# Patient Record
Sex: Female | Born: 1950 | Race: White | Hispanic: No | Smoking: Never smoker
Health system: Southern US, Community
[De-identification: ages and names within clinical notes are randomized; demographics above are authoritative.]

## PROBLEM LIST (undated history)

## (undated) DIAGNOSIS — R82993 Hyperuricosuria: Secondary | ICD-10-CM

## (undated) DIAGNOSIS — T7840XA Allergy, unspecified, initial encounter: Secondary | ICD-10-CM

## (undated) DIAGNOSIS — N200 Calculus of kidney: Secondary | ICD-10-CM

## (undated) DIAGNOSIS — R82994 Hypercalciuria: Secondary | ICD-10-CM

## (undated) HISTORY — PX: TONSILLECTOMY: SUR1361

## (undated) HISTORY — PX: OTHER SURGICAL HISTORY: SHX169

## (undated) HISTORY — DX: Calculus of kidney: N20.0

## (undated) HISTORY — PX: ABDOMINAL HYSTERECTOMY: SHX81

## (undated) HISTORY — DX: Allergy, unspecified, initial encounter: T78.40XA

## (undated) HISTORY — PX: ADENOIDECTOMY: SUR15

## (undated) HISTORY — DX: Hypercalciuria: R82.994

## (undated) HISTORY — DX: Hyperuricosuria: R82.993

---

## 2018-06-16 DIAGNOSIS — N200 Calculus of kidney: Secondary | ICD-10-CM | POA: Insufficient documentation

## 2018-12-15 DIAGNOSIS — R82994 Hypercalciuria: Secondary | ICD-10-CM | POA: Insufficient documentation

## 2018-12-15 DIAGNOSIS — R82993 Hyperuricosuria: Secondary | ICD-10-CM | POA: Insufficient documentation

## 2021-04-07 ENCOUNTER — Inpatient Hospital Stay: Payer: Medicare Other | Attending: Internal Medicine | Admitting: Internal Medicine

## 2021-04-07 ENCOUNTER — Other Ambulatory Visit: Payer: Self-pay

## 2021-04-07 ENCOUNTER — Inpatient Hospital Stay: Payer: Medicare Other

## 2021-04-07 ENCOUNTER — Encounter: Payer: Self-pay | Admitting: Internal Medicine

## 2021-04-07 DIAGNOSIS — Z79899 Other long term (current) drug therapy: Secondary | ICD-10-CM | POA: Diagnosis not present

## 2021-04-07 DIAGNOSIS — Z808 Family history of malignant neoplasm of other organs or systems: Secondary | ICD-10-CM | POA: Diagnosis not present

## 2021-04-07 DIAGNOSIS — Z8049 Family history of malignant neoplasm of other genital organs: Secondary | ICD-10-CM

## 2021-04-07 DIAGNOSIS — D72820 Lymphocytosis (symptomatic): Secondary | ICD-10-CM | POA: Diagnosis not present

## 2021-04-07 DIAGNOSIS — D751 Secondary polycythemia: Secondary | ICD-10-CM

## 2021-04-07 DIAGNOSIS — Z809 Family history of malignant neoplasm, unspecified: Secondary | ICD-10-CM

## 2021-04-07 LAB — CBC WITH DIFFERENTIAL/PLATELET
Abs Immature Granulocytes: 0 10*3/uL (ref 0.00–0.07)
Band Neutrophils: 0 %
Basophils Absolute: 0 10*3/uL (ref 0.0–0.1)
Basophils Relative: 0 %
Blasts: 0 %
Eosinophils Absolute: 0.1 10*3/uL (ref 0.0–0.5)
Eosinophils Relative: 1 %
HCT: 47.6 % — ABNORMAL HIGH (ref 36.0–46.0)
Hemoglobin: 16 g/dL — ABNORMAL HIGH (ref 12.0–15.0)
Lymphocytes Relative: 73 %
Lymphs Abs: 10.2 10*3/uL — ABNORMAL HIGH (ref 0.7–4.0)
MCH: 31.6 pg (ref 26.0–34.0)
MCHC: 33.6 g/dL (ref 30.0–36.0)
MCV: 94.1 fL (ref 80.0–100.0)
Metamyelocytes Relative: 0 %
Monocytes Absolute: 0.4 10*3/uL (ref 0.1–1.0)
Monocytes Relative: 3 %
Myelocytes: 0 %
Neutro Abs: 3.2 10*3/uL (ref 1.7–7.7)
Neutrophils Relative %: 23 %
Other: 0 %
Platelets: 282 10*3/uL (ref 150–400)
Promyelocytes Relative: 0 %
RBC: 5.06 MIL/uL (ref 3.87–5.11)
RDW: 13.2 % (ref 11.5–15.5)
Smear Review: ADEQUATE
WBC: 13.9 10*3/uL — ABNORMAL HIGH (ref 4.0–10.5)
nRBC: 0 % (ref 0.0–0.2)
nRBC: 0 /100 WBC

## 2021-04-07 LAB — LACTATE DEHYDROGENASE: LDH: 151 U/L (ref 98–192)

## 2021-04-07 NOTE — Progress Notes (Signed)
New patient evaluation.   

## 2021-04-07 NOTE — Progress Notes (Signed)
[name] OFFICE PROGRESS NOTE  No care team member to display   # HEMATOLOGY HISTORY:  # LEUCOCYTOSIS- WBC-; N; L; Hb- platelets   Oncology History   No history exists.      INTERVAL HISTORY: Ambulating independently.  Accompanied by husband/wheelchair.  Erica Barton 70 y.o.  female pleasant patient without any significant past medical history noted to have slightly elevated lymphocytes along with leukocytosis-referred to [place] for further evaluation recommendations.  Patient denies any nausea vomiting abdominal pain.  Night sweats: None Weight loss: None Early satiety: None   Review of Systems  Constitutional:  Negative for chills, diaphoresis, fever, malaise/fatigue and weight loss.  HENT:  Negative for nosebleeds and sore throat.   Eyes:  Negative for double vision.  Respiratory:  Negative for cough, hemoptysis, sputum production, shortness of breath and wheezing.   Cardiovascular:  Negative for chest pain, palpitations, orthopnea and leg swelling.  Gastrointestinal:  Negative for abdominal pain, blood in stool, constipation, diarrhea, heartburn, melena, nausea and vomiting.  Genitourinary:  Negative for dysuria, frequency and urgency.  Musculoskeletal:  Negative for back pain and joint pain.  Skin: Negative.  Negative for itching and rash.  Neurological:  Negative for dizziness, tingling, focal weakness, weakness and headaches.  Endo/Heme/Allergies:  Does not bruise/bleed easily.  Psychiatric/Behavioral:  Negative for depression. The patient is not nervous/anxious and does not have insomnia.      PAST MEDICAL HISTORY :  Past Medical History:  Diagnosis Date   Allergy    Hypercalciuria    Hyperuricosuria    Nephrolithiasis     PAST SURGICAL HISTORY :   Past Surgical History:  Procedure Laterality Date   ABDOMINAL HYSTERECTOMY     ADENOIDECTOMY     anterior cervicl cal fusion     TONSILLECTOMY     triger finger release Left     FAMILY  HISTORY :   Family History  Problem Relation Age of Onset   Uterine cancer Mother    Non-Hodgkin's lymphoma Brother    Cancer Maternal Grandmother        unknown   Cancer Maternal Grandfather        unknown   Cancer Paternal Grandmother        unknown   Cancer Paternal Grandfather        unknown    SOCIAL HISTORY:   Social History   Tobacco Use   Smoking status: Never   Smokeless tobacco: Never  Substance Use Topics   Alcohol use: Never   Drug use: Never    ALLERGIES:  is allergic to beef (bovine) protein, contrast media [iodinated diagnostic agents], and gadolinium derivatives.  MEDICATIONS:  Current Outpatient Medications  Medication Sig Dispense Refill   amiloride-hydrochlorothiazide (MODURETIC) 5-50 MG tablet Take 1 tablet by mouth daily.     Cholecalciferol (VITAMIN D) 50 MCG (2000 UT) tablet Take 2,000 Units by mouth daily.     diphenhydrAMINE (BENADRYL) 50 MG capsule Take by mouth.     EPINEPHrine 0.3 mg/0.3 mL IJ SOAJ injection epinephrine 0.3 mg/0.3 mL injection, auto-injector     estradiol (ESTRACE) 0.1 MG/GM vaginal cream Place 1 g vaginally once a week.     hydrOXYzine (ATARAX/VISTARIL) 25 MG tablet 1-2 every 6-8 hours for itching     Multiple Vitamins-Minerals (CENTRUM SILVER) tablet Centrum Silver  1 tablet by mouth once daily     Omega-3 Fatty Acids (OMEGA-3 2100) 1050 MG CAPS Omega 3  1280 mg - 3 capsules once daily  predniSONE (DELTASONE) 50 MG tablet Take by mouth.     No current facility-administered medications for this visit.    PHYSICAL EXAMINATION:  BP 123/76   Pulse 87   Temp (!) 96.4 F (35.8 C)   Resp 16   Wt 140 lb (63.5 kg)   Filed Weights   04/07/21 1115  Weight: 140 lb (63.5 kg)    Physical Exam Vitals and nursing note reviewed.  HENT:     Head: Normocephalic and atraumatic.     Mouth/Throat:     Pharynx: Oropharynx is clear.  Eyes:     Extraocular Movements: Extraocular movements intact.     Pupils: Pupils are  equal, round, and reactive to light.  Cardiovascular:     Rate and Rhythm: Normal rate and regular rhythm.  Pulmonary:     Comments: Decreased breath sounds bilaterally.  Abdominal:     Palpations: Abdomen is soft.  Musculoskeletal:        General: Normal range of motion.     Cervical back: Normal range of motion.  Skin:    General: Skin is warm.  Neurological:     General: No focal deficit present.     Mental Status: She is alert and oriented to person, place, and time.  Psychiatric:        Behavior: Behavior normal.        Judgment: Judgment normal.       LABORATORY DATA:  I have reviewed the data as listed No results found for: NA, K, CL, CO2, GLUCOSE, BUN, CREATININE, CALCIUM, PROT, ALBUMIN, AST, ALT, ALKPHOS, BILITOT, GFRNONAA, GFRAA  No results found for: SPEP, UPEP  Lab Results  Component Value Date   WBC 13.9 (H) 04/07/2021   NEUTROABS 3.2 04/07/2021   HGB 16.0 (H) 04/07/2021   HCT 47.6 (H) 04/07/2021   MCV 94.1 04/07/2021   PLT 282 04/07/2021      Chemistry   No results found for: NA, K, CL, CO2, BUN, CREATININE, GLU No results found for: CALCIUM, ALKPHOS, AST, ALT, BILITOT     RADIOGRAPHIC STUDIES: I have personally reviewed the radiological images as listed and agreed with the findings in the report. No results found.   ASSESSMENT & PLAN:  Lymphocytosis # SEP 2022- WBC 15; ALC 12; Hb-16; platlets normal. peripheral smear- atypical lymphocytes.  However.  Patient fairly asymptomatic from underlying blood work.  #Discussed with the patient and family that-lymphocytosis is likely suggestive of a low-grade lymphoma/leukemia; less likely reactive.  Recommend CBC CMP LDH; peripheral blood flow cytometry.  Hold off a bone marrow biopsy at this time.  CT scan October 2022-negative.  # Discussed that if patient is diagnosed with low-grade lymphoma/leukemia-especially asymptomatic/with normal hemoglobin/platelets-surveillance would be the choice of management.   However await above work-up for further recommendations and plan.  # Elevated Hb 16/HCT 46-no evidence of any hypoxia or smoking.  Check JAK2 mutation.  Thank you Dr.Lambert for allowing me to participate in the care of your pleasant patient. Please do not hesitate to contact me with questions or concerns in the interim.  # DISPOSITION: # labs today-  # # Follow up in 2 weeks- MD; virtual- No labs-Dr.B    Orders Placed This Encounter  Procedures   Flow cytometry panel-leukemia/lymphoma work-up    Standing Status:   Future    Number of Occurrences:   1    Standing Expiration Date:   04/07/2022   JAK2 genotypr    Standing Status:   Future  Number of Occurrences:   1    Standing Expiration Date:   04/07/2022   CBC with Differential/Platelet    Standing Status:   Future    Number of Occurrences:   1    Standing Expiration Date:   04/07/2022   Lactate dehydrogenase    Standing Status:   Future    Number of Occurrences:   1    Standing Expiration Date:   04/07/2022    All questions were answered. The patient knows to call the clinic with any problems, questions or concerns.      [name], MD 04/12/2021 10:18 PM

## 2021-04-07 NOTE — Assessment & Plan Note (Addendum)
#  SEP 2022- WBC 15; ALC 12; Hb-16; platlets normal. peripheral smear- atypical lymphocytes.  However.  Patient fairly asymptomatic from underlying blood work.  #Discussed with the patient and family that-lymphocytosis is likely suggestive of a low-grade lymphoma/leukemia; less likely reactive.  Recommend CBC CMP LDH; peripheral blood flow cytometry.  Hold off a bone marrow biopsy at this time.  CT scan October 2022-negative.  # Discussed that if patient is diagnosed with low-grade lymphoma/leukemia-especially asymptomatic/with normal hemoglobin/platelets-surveillance would be the choice of management.  However await above work-up for further recommendations and plan.  # Elevated Hb 16/HCT 46-no evidence of any hypoxia or smoking.  Check JAK2 mutation.  Thank you Dr.Lambert for allowing me to participate in the care of your pleasant patient. Please do not hesitate to contact me with questions or concerns in the interim.  # DISPOSITION: # labs today-  # # Follow up in 2 weeks- MD; virtual- No labs-Dr.B

## 2021-04-11 LAB — COMP PANEL: LEUKEMIA/LYMPHOMA: Immunophenotypic Profile: 51

## 2021-04-17 LAB — JAK2 GENOTYPR

## 2021-04-18 ENCOUNTER — Other Ambulatory Visit: Payer: Self-pay

## 2021-04-18 ENCOUNTER — Inpatient Hospital Stay: Payer: Medicare Other | Attending: Internal Medicine | Admitting: Internal Medicine

## 2021-04-18 DIAGNOSIS — C9110 Chronic lymphocytic leukemia of B-cell type not having achieved remission: Secondary | ICD-10-CM

## 2021-04-18 DIAGNOSIS — D72820 Lymphocytosis (symptomatic): Secondary | ICD-10-CM

## 2021-04-18 NOTE — Assessment & Plan Note (Signed)
#  SEP 2022- WBC 15; ALC 12; Hb-16; platlets normal. peripheral smear- atypical lymphocytes.  However.  Patient fairly asymptomatic.  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  Recommend CT scan chest and pelvis at this time.   #Discussed that as patient is asymptomatic at this time-would recommend holding off any bone marrow biopsy; unless the CT scan is concerning for active lymphoma.  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 monitor for now.  Low clinical concerns for thromboembolic events at this time.  # DISPOSITION: # CT CAP in 1-2 weeks- # follow up TBD- Dr.B  

## 2021-04-18 NOTE — Progress Notes (Signed)
I connected with Erica Barton on 04/18/21 at  3:30 PM EST by video enabled telemedicine visit and verified that I am speaking with the correct person using two identifiers.  I discussed the limitations, risks, security and privacy concerns of performing an evaluation and management service by telemedicine and the availability of in-person appointments. I also discussed with the patient that there may be a patient responsible charge related to this service. The patient expressed understanding and agreed to proceed.    Other persons participating in the visit and their role in the encounter: RN/medical reconciliation Patient's location: home Provider's location: office  Oncology History   No history exists.     Chief Complaint: Follow-up of lymphocytosis    History of present illness:Erica Barton 70 y.o.  female with history of lymphocytosis is here today with results of blood work.  Patient continues to be asymptomatic.  No fever no chills.  Observation/objective: Alert & oriented x 3. In No acute distress.   Assessment and plan: Lymphocytosis # SEP 2022- WBC 15; ALC 12; Hb-16; platlets normal. peripheral smear- atypical lymphocytes.  However.  Patient fairly asymptomatic.  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  Recommend CT scan chest and pelvis at this time.   #Discussed that as patient is asymptomatic at this time-would recommend holding off any bone marrow biopsy; unless the CT scan is concerning for active lymphoma.  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 monitor for now.  Low clinical concerns for thromboembolic events at this time.  # DISPOSITION: # CT CAP in 1-2 weeks- # follow up TBD- Dr.B  Follow-up instructions:  I discussed the assessment and treatment plan with the patient.  The patient was provided an opportunity to ask questions and all were answered.  The patient  agreed with the plan and demonstrated understanding of instructions.  The patient was advised to call back or seek an in person evaluation if the symptoms worsen or if the condition fails to improve as anticipated.  Dr. Charlaine Dalton [place] at [facility] 04/18/2021 4:12 PM

## 2021-05-04 ENCOUNTER — Encounter: Payer: Self-pay | Admitting: Internal Medicine

## 2021-05-11 ENCOUNTER — Other Ambulatory Visit: Payer: Self-pay

## 2021-05-11 ENCOUNTER — Ambulatory Visit
Admission: RE | Admit: 2021-05-11 | Discharge: 2021-05-11 | Disposition: A | Discharge status: Home / Self Care | Payer: Medicare Other | Source: Ambulatory Visit | Attending: Internal Medicine | Admitting: Internal Medicine

## 2021-05-11 DIAGNOSIS — C9110 Chronic lymphocytic leukemia of B-cell type not having achieved remission: Secondary | ICD-10-CM | POA: Diagnosis not present

## 2021-05-11 LAB — POCT I-STAT CREATININE: Creatinine, Ser: 0.8 mg/dL (ref 0.44–1.00)

## 2021-05-11 MED ORDER — IOHEXOL 300 MG/ML  SOLN
100.0000 mL | Freq: Once | INTRAMUSCULAR | Status: AC | PRN
  Administered 2021-05-11: 100 mL via INTRAVENOUS

## 2021-05-12 ENCOUNTER — Telehealth: Payer: Self-pay | Admitting: Internal Medicine

## 2021-05-12 DIAGNOSIS — C9110 Chronic lymphocytic leukemia of B-cell type not having achieved remission: Secondary | ICD-10-CM

## 2021-05-12 DIAGNOSIS — D72820 Lymphocytosis (symptomatic): Secondary | ICD-10-CM

## 2021-05-12 NOTE — Telephone Encounter (Signed)
11/8 check out:  CT CAP in 1-2 weeks- # follow up TBD.    What f/u needs to be scheduled?

## 2021-05-12 NOTE — Telephone Encounter (Signed)
Pt called in to set up appt to get results for her lab work that was done 12-1.Please call back at [phone]

## 2021-05-12 NOTE — Telephone Encounter (Signed)
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  So no further imaging needed at this time.   R-please schedule follow-up in 6 months-MD labs CBC CMP LDH-Dr.B  Thanks GB

## 2021-05-15 NOTE — Addendum Note (Signed)
Addended by: Vanice Sarah on: 05/15/2021 09:37 AM   Modules accepted: Orders

## 2021-11-13 ENCOUNTER — Encounter: Payer: Self-pay | Admitting: Internal Medicine

## 2021-11-13 ENCOUNTER — Inpatient Hospital Stay (HOSPITAL_BASED_OUTPATIENT_CLINIC_OR_DEPARTMENT_OTHER): Payer: Medicare Other | Admitting: Internal Medicine

## 2021-11-13 ENCOUNTER — Inpatient Hospital Stay: Payer: Medicare Other | Attending: Internal Medicine

## 2021-11-13 DIAGNOSIS — C8310 Mantle cell lymphoma, unspecified site: Secondary | ICD-10-CM | POA: Insufficient documentation

## 2021-11-13 DIAGNOSIS — D582 Other hemoglobinopathies: Secondary | ICD-10-CM | POA: Insufficient documentation

## 2021-11-13 DIAGNOSIS — C9110 Chronic lymphocytic leukemia of B-cell type not having achieved remission: Secondary | ICD-10-CM | POA: Insufficient documentation

## 2021-11-13 DIAGNOSIS — D47Z9 Other specified neoplasms of uncertain behavior of lymphoid, hematopoietic and related tissue: Secondary | ICD-10-CM

## 2021-11-13 DIAGNOSIS — R718 Other abnormality of red blood cells: Secondary | ICD-10-CM | POA: Insufficient documentation

## 2021-11-13 DIAGNOSIS — Z79899 Other long term (current) drug therapy: Secondary | ICD-10-CM | POA: Insufficient documentation

## 2021-11-13 DIAGNOSIS — D72820 Lymphocytosis (symptomatic): Secondary | ICD-10-CM

## 2021-11-13 LAB — COMPREHENSIVE METABOLIC PANEL
ALT: 29 U/L (ref 0–44)
AST: 26 U/L (ref 15–41)
Albumin: 3.8 g/dL (ref 3.5–5.0)
Alkaline Phosphatase: 52 U/L (ref 38–126)
Anion gap: 6 (ref 5–15)
BUN: 13 mg/dL (ref 8–23)
CO2: 28 mmol/L (ref 22–32)
Calcium: 9.3 mg/dL (ref 8.9–10.3)
Chloride: 100 mmol/L (ref 98–111)
Creatinine, Ser: 0.62 mg/dL (ref 0.44–1.00)
GFR, Estimated: >60 mL/min (ref >60)
Glucose, Bld: 96 mg/dL (ref 70–99)
Potassium: 3.8 mmol/L (ref 3.5–5.1)
Sodium: 134 mmol/L — ABNORMAL LOW (ref 135–145)
Total Bilirubin: 0.8 mg/dL (ref 0.3–1.2)
Total Protein: 6.6 g/dL (ref 6.5–8.1)

## 2021-11-13 LAB — CBC WITH DIFFERENTIAL/PLATELET
Abs Immature Granulocytes: 0.03 10*3/uL (ref 0.00–0.07)
Basophils Absolute: 0 10*3/uL (ref 0.0–0.1)
Basophils Relative: 0 %
Eosinophils Absolute: 0.1 10*3/uL (ref 0.0–0.5)
Eosinophils Relative: 1 %
HCT: 48.7 % — ABNORMAL HIGH (ref 36.0–46.0)
Hemoglobin: 16.5 g/dL — ABNORMAL HIGH (ref 12.0–15.0)
Immature Granulocytes: 0 %
Lymphocytes Relative: 76 %
Lymphs Abs: 12 10*3/uL — ABNORMAL HIGH (ref 0.7–4.0)
MCH: 31.5 pg (ref 26.0–34.0)
MCHC: 33.9 g/dL (ref 30.0–36.0)
MCV: 93.1 fL (ref 80.0–100.0)
Monocytes Absolute: 0.6 10*3/uL (ref 0.1–1.0)
Monocytes Relative: 4 %
Neutro Abs: 3 10*3/uL (ref 1.7–7.7)
Neutrophils Relative %: 19 %
Platelets: 272 10*3/uL (ref 150–400)
RBC: 5.23 MIL/uL — ABNORMAL HIGH (ref 3.87–5.11)
RDW: 12.9 % (ref 11.5–15.5)
Smear Review: NORMAL
WBC: 15.8 10*3/uL — ABNORMAL HIGH (ref 4.0–10.5)
nRBC: 0 % (ref 0.0–0.2)

## 2021-11-13 LAB — LACTATE DEHYDROGENASE: LDH: 161 U/L (ref 98–192)

## 2021-11-13 NOTE — Progress Notes (Signed)
[facility] OFFICE PROGRESS NOTE  Patient Care Team: Pcp, No as PCP - General [name], MD as Consulting Physician (Oncology)   # HEMATOLOGY HISTORY:  # LEUCOCYTOSIS- WBC-; N; L; Hb- platelets   Oncology History   No history exists.      INTERVAL HISTORY: Ambulating independently.  Alone.  Erica Barton 71 y.o.  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  Unfortunately patient's husband passed last week.  Patient is obviously upset with her loss.  However she denies any significant weight loss.  Denies any night sweats.  Denies any nausea vomiting.  No new lumps or bumps.    Review of Systems  Constitutional:  Negative for chills, diaphoresis, fever, malaise/fatigue and weight loss.  HENT:  Negative for nosebleeds and sore throat.   Eyes:  Negative for double vision.  Respiratory:  Negative for cough, hemoptysis, sputum production, shortness of breath and wheezing.   Cardiovascular:  Negative for chest pain, palpitations, orthopnea and leg swelling.  Gastrointestinal:  Negative for abdominal pain, blood in stool, constipation, diarrhea, heartburn, melena, nausea and vomiting.  Genitourinary:  Negative for dysuria, frequency and urgency.  Musculoskeletal:  Negative for back pain and joint pain.  Skin: Negative.  Negative for itching and rash.  Neurological:  Negative for dizziness, tingling, focal weakness, weakness and headaches.  Endo/Heme/Allergies:  Does not bruise/bleed easily.  Psychiatric/Behavioral:  Negative for depression. The patient is not nervous/anxious and does not have insomnia.      PAST MEDICAL HISTORY :  Past Medical History:  Diagnosis Date   Allergy    Hypercalciuria    Hyperuricosuria    Nephrolithiasis     PAST SURGICAL HISTORY :   Past Surgical History:  Procedure Laterality Date   ABDOMINAL HYSTERECTOMY      ADENOIDECTOMY     anterior cervicl cal fusion     TONSILLECTOMY     triger finger release Left     FAMILY HISTORY :   Family History  Problem Relation Age of Onset   Uterine cancer Mother    Non-Hodgkin's lymphoma Brother    Cancer Maternal Grandmother        unknown   Cancer Maternal Grandfather        unknown   Cancer Paternal Grandmother        unknown   Cancer Paternal Grandfather        unknown    SOCIAL HISTORY:   Social History   Tobacco Use   Smoking status: Never   Smokeless tobacco: Never  Substance Use Topics   Alcohol use: Never   Drug use: Never    ALLERGIES:  is allergic to beef (bovine) protein, contrast media [iodinated contrast media], and gadolinium derivatives.  MEDICATIONS:  Current Outpatient Medications  Medication Sig Dispense Refill   amiloride-hydrochlorothiazide (MODURETIC) 5-50 MG tablet Take 1 tablet by mouth daily.     Cholecalciferol (VITAMIN D) 50 MCG (2000 UT) tablet Take 2,000 Units by mouth daily.     diphenhydrAMINE (BENADRYL) 50 MG capsule Take by mouth.     estradiol (ESTRACE) 0.1 MG/GM vaginal cream Place 1 g vaginally once a week.     hydrOXYzine (ATARAX/VISTARIL) 25 MG tablet 1-2 every 6-8 hours for itching     Multiple Vitamins-Minerals (CENTRUM SILVER) tablet Centrum Silver  1 tablet by mouth once daily     Omega-3 Fatty Acids (OMEGA-3 2100) 1050 MG CAPS Omega 3  1280 mg - 3 capsules once daily     predniSONE (DELTASONE) 50 MG tablet Take by mouth.     EPINEPHrine 0.3 mg/0.3 mL IJ SOAJ injection epinephrine 0.3 mg/0.3 mL injection, auto-injector (Patient not taking: Reported on 04/18/2021)     No current facility-administered medications for this visit.    PHYSICAL EXAMINATION:  BP 118/73 (Patient Position: Sitting)   Pulse 62   Temp (!) 97 F (36.1 C) (Tympanic)   Resp 17   Wt 136 lb 12.8 oz (62.1 kg)   SpO2 100%   Filed Weights   11/13/21 1100  Weight: 136 lb 12.8 oz (62.1 kg)    Physical Exam Vitals and  nursing note reviewed.  HENT:     Head: Normocephalic and atraumatic.     Mouth/Throat:     Pharynx: Oropharynx is clear.  Eyes:     Extraocular Movements: Extraocular movements intact.     Pupils: Pupils are equal, round, and reactive to light.  Cardiovascular:     Rate and Rhythm: Normal rate and regular rhythm.  Pulmonary:     Comments: Decreased breath sounds bilaterally.  Abdominal:     Palpations: Abdomen is soft.  Musculoskeletal:        General: Normal range of motion.     Cervical back: Normal range of motion.  Skin:    General: Skin is warm.  Neurological:     General: No focal deficit present.     Mental Status: She is alert and oriented to person, place, and time.  Psychiatric:        Behavior: Behavior normal.        Judgment: Judgment normal.       LABORATORY DATA:  I have reviewed the data as listed    Component Value Date/Time   NA 134 (L) 11/13/2021 0916   K 3.8 11/13/2021 0916   CL 100 11/13/2021 0916   CO2 28 11/13/2021 0916   GLUCOSE 96 11/13/2021 0916   BUN 13 11/13/2021 0916   CREATININE 0.62 11/13/2021 0916   CALCIUM 9.3 11/13/2021 0916   PROT 6.6 11/13/2021 0916   ALBUMIN 3.8 11/13/2021 0916   AST 26 11/13/2021 0916   ALT 29 11/13/2021 0916   ALKPHOS 52 11/13/2021 0916   BILITOT 0.8 11/13/2021 0916   GFRNONAA >60 11/13/2021 0916    No results found for: SPEP, UPEP  Lab Results  Component Value Date   WBC 15.8 (H) 11/13/2021   NEUTROABS 3.0 11/13/2021   HGB 16.5 (H) 11/13/2021   HCT 48.7 (H) 11/13/2021   MCV 93.1 11/13/2021   PLT 272 11/13/2021      Chemistry      Component Value Date/Time   NA 134 (L) 11/13/2021 0916   K 3.8 11/13/2021 0916   CL 100 11/13/2021 0916   CO2 28 11/13/2021 0916   BUN 13 11/13/2021 0916   CREATININE 0.62 11/13/2021 0916      Component Value Date/Time   CALCIUM 9.3 11/13/2021 0916   ALKPHOS 52 11/13/2021 0916   AST 26 11/13/2021 0916   ALT 29 11/13/2021 0916   BILITOT 0.8 11/13/2021 0916        RADIOGRAPHIC STUDIES: I have personally reviewed the radiological images as listed and agreed with the findings in the report. No results found.   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  B-cell low-grade lymphoproliferative disorder.   Flow cytometry-CD5 positive CD10 negative  lymphoproliferative disorder [atypical CLL/mantle cell lymphoma].  DEC 2022-  No evidence of lymphadenopathy in the chest, abdomen, or pelvis;  Normal spleen;  There is a 3.2 x 1.8 cm soft tissue attenuation nodule of the left adrenal gland, most likely a benign, incidental adrenal adenoma. [2019-[facility]- STABLE. ].   #Discussed that as patient is asymptomatic at this time-would recommend holding off any bone marrow biopsy; unless develops any signs and symptoms of lymphoma.  Or significant concerns noted on imaging.  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 monitor for now.  Low clinical concerns for thromboembolic events at this time.  Monitor for now.  # DISPOSITION: # follow up in 6 months- MD; labs- cbc/cmp;LDH Dr.B   Orders Placed This Encounter  Procedures   CBC with Differential/Platelet    Standing Status:   Future    Standing Expiration Date:   11/14/2022   Comprehensive metabolic panel    Standing Status:   Future    Standing Expiration Date:   11/14/2022   Lactate dehydrogenase    Standing Status:   Future    Standing Expiration Date:   11/14/2022    All questions were answered. The patient knows to call the clinic with any problems, questions or concerns.      [name], MD 11/13/2021 10:06 PM

## 2021-11-13 NOTE — Progress Notes (Signed)
Patient here for oncology follow-up appointment, expresses no new concerns     

## 2021-11-13 NOTE — Assessment & Plan Note (Deleted)
#  SEP 2022- WBC 15; ALC 12; Hb-16; platlets normal. peripheral smear- atypical lymphocytes.  However.  Patient fairly asymptomatic.  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  Recommend CT scan chest and pelvis at this time.  No evidence of lymphadenopathy in the chest, abdomen, or pelvis. 2. Normal spleen. 3. There is a 3.2 x 1.8 cm soft tissue attenuation nodule of the left adrenal gland, most likely a benign, incidental adrenal adenoma. Recommend adrenal protocol CT or MRI to further evaluate given size. Comparison to prior imaging, if available, could also be made to establish stability and benign nature. 4. Nonobstructive left nephrolithiasis.  Aortic Atherosclerosis (ICD10-I70.0).   Electronically Signed   By: Delanna Ahmadi M.D.   On: 05/12/2021 14:38   #Discussed that as patient is asymptomatic at this time-would recommend holding off any bone marrow biopsy; unless the CT scan is concerning for active lymphoma.  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 monitor for now.  Low clinical concerns for thromboembolic events at this time.  # DISPOSITION: # follow up in 6 months- MD; labs- cbc/cmp;LD[facility] Dr.B

## 2021-11-13 NOTE — Assessment & Plan Note (Addendum)
#  SEP 2022- WBC 15; ALC 12; Hb-16; platlets normal.  B-cell low-grade lymphoproliferative disorder.   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  DEC 2022-  No evidence of lymphadenopathy in the chest, abdomen, or pelvis;  Normal spleen;  There is a 3.2 x 1.8 cm soft tissue attenuation nodule of the left adrenal gland, most likely a benign, incidental adrenal adenoma. [2019-[facility]- STABLE. ].   #Discussed that as patient is asymptomatic at this time-would recommend holding off any bone marrow biopsy; unless develops any signs and symptoms of lymphoma.  Or significant concerns noted on imaging.  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 monitor for now.  Low clinical concerns for thromboembolic events at this time.  Monitor for now.  # DISPOSITION: # follow up in 6 months- MD; labs- cbc/cmp;LD[facility] Dr.B

## 2022-01-18 ENCOUNTER — Telehealth: Payer: Self-pay | Admitting: *Deleted

## 2022-01-18 ENCOUNTER — Encounter: Payer: Self-pay | Admitting: Internal Medicine

## 2022-01-18 NOTE — Telephone Encounter (Signed)
Received a call from patient PCP office [facility] / Dr Quentin Ore. Patient has some very concerning lab results ABs Lymph 15K smear showing consistent with Lymphoproliferative disorder. They are requesting patient be seen ASAP or asking if they need to send her to ER. Please advise Labs are not in care everywhere yet, she is going to fax results to [place] now

## 2022-01-18 NOTE — Progress Notes (Signed)
I spoke to patient regarding the blood work done at PCP office.  Absolute lymphocyte count of 15; 2 months ago 12.  Unlikely cause of patient's current symptoms of shortness of breath/premature contractions.   Offered to have her come sooner in clinic, discussed re: bone marrow - ok to hold off. Plan follow up in Dec as planned.  GB

## 2022-01-19 ENCOUNTER — Telehealth: Payer: Self-pay

## 2022-01-19 NOTE — Telephone Encounter (Signed)
MD spoke to Patient

## 2022-01-19 NOTE — Telephone Encounter (Signed)
NOTES SCANNED TO REFERRAL 

## 2022-01-22 ENCOUNTER — Other Ambulatory Visit (HOSPITAL_BASED_OUTPATIENT_CLINIC_OR_DEPARTMENT_OTHER): Payer: Self-pay | Admitting: Family Medicine

## 2022-01-22 DIAGNOSIS — R0600 Dyspnea, unspecified: Secondary | ICD-10-CM

## 2022-01-26 ENCOUNTER — Encounter (HOSPITAL_BASED_OUTPATIENT_CLINIC_OR_DEPARTMENT_OTHER): Payer: Self-pay | Admitting: Radiology

## 2022-01-27 ENCOUNTER — Ambulatory Visit (HOSPITAL_BASED_OUTPATIENT_CLINIC_OR_DEPARTMENT_OTHER)
Admission: RE | Admit: 2022-01-27 | Discharge: 2022-01-27 | Disposition: A | Discharge status: Home / Self Care | Payer: Medicare Other | Source: Ambulatory Visit | Attending: Family Medicine | Admitting: Family Medicine

## 2022-01-27 DIAGNOSIS — R0600 Dyspnea, unspecified: Secondary | ICD-10-CM | POA: Insufficient documentation

## 2022-01-27 MED ORDER — IOHEXOL 350 MG/ML SOLN
100.0000 mL | Freq: Once | INTRAVENOUS | Status: AC | PRN
  Administered 2022-01-27: 100 mL via INTRAVENOUS

## 2022-02-02 ENCOUNTER — Other Ambulatory Visit: Payer: Self-pay | Admitting: *Deleted

## 2022-02-02 ENCOUNTER — Ambulatory Visit (INDEPENDENT_AMBULATORY_CARE_PROVIDER_SITE_OTHER): Payer: Medicare Other

## 2022-02-02 DIAGNOSIS — R002 Palpitations: Secondary | ICD-10-CM

## 2022-02-02 NOTE — Progress Notes (Unsigned)
Enrolled for [employer] to mail a ZIO XT long term holter monitor to the patients address on file.   Dr. Buford Dresser to read.

## 2022-02-05 DIAGNOSIS — R002 Palpitations: Secondary | ICD-10-CM

## 2022-02-15 ENCOUNTER — Encounter (HOSPITAL_BASED_OUTPATIENT_CLINIC_OR_DEPARTMENT_OTHER): Payer: Self-pay | Admitting: Cardiology

## 2022-02-15 ENCOUNTER — Ambulatory Visit (INDEPENDENT_AMBULATORY_CARE_PROVIDER_SITE_OTHER): Payer: Medicare Other | Admitting: Cardiology

## 2022-02-15 VITALS — BP 114/64 | HR 94 | Ht 66.0 in | Wt 141.3 lb

## 2022-02-15 DIAGNOSIS — I493 Ventricular premature depolarization: Secondary | ICD-10-CM | POA: Diagnosis not present

## 2022-02-15 DIAGNOSIS — I10 Essential (primary) hypertension: Secondary | ICD-10-CM

## 2022-02-15 DIAGNOSIS — R0602 Shortness of breath: Secondary | ICD-10-CM | POA: Diagnosis not present

## 2022-02-15 DIAGNOSIS — R002 Palpitations: Secondary | ICD-10-CM | POA: Diagnosis not present

## 2022-02-15 DIAGNOSIS — Z7189 Other specified counseling: Secondary | ICD-10-CM | POA: Diagnosis not present

## 2022-02-15 NOTE — Patient Instructions (Signed)
Medication Instructions:  Your Physician recommend you continue on your current medication as directed.    *If you need a refill on your cardiac medications before your next appointment, please call your pharmacy*   Lab Work: None ordered today   Testing/Procedures: None ordered today   Follow-Up: At [facility], you and your health needs are our priority.  As part of our continuing mission to provide you with exceptional heart care, we have created designated Provider Care Teams.  These Care Teams include your primary Cardiologist (physician) and Advanced Practice Providers (APPs -  Physician Assistants and Nurse Practitioners) who all work together to provide you with the care you need, when you need it.  We recommend signing up for the patient portal called "MyChart".  Sign up information is provided on this After Visit Summary.  MyChart is used to connect with patients for Virtual Visits (Telemedicine).  Patients are able to view lab/test results, encounter notes, upcoming appointments, etc.  Non-urgent messages can be sent to your provider as well.   To learn more about what you can do with MyChart, go to [email or web address].    Your next appointment:   3-4 week(s)  The format for your next appointment:   In Person  Provider:   Buford Dresser, MD

## 2022-02-15 NOTE — Progress Notes (Signed)
Cardiology Office Note:    Date:  02/15/2022   ID:  Erica Barton, DOB 1950-11-05, MRN [id number]  PCP:  Natividad Brood, MD  Cardiologist:  Buford Dresser, MD  Referring MD: Natividad Brood, MD   No chief complaint on file.   History of Present Illness:    Erica Barton is a 71 y.o. female with a hx of nephrolithiasis, hyperuricosuria, and hypercalciuria, who is seen as a new consult at the request of Natividad Brood, MD for the evaluation and management of palpitations.  Referral notes from Dr. Natividad Brood personally reviewed. She reported an increasing frequency of palpitations and shortness of breath, not associated with activity or caffeine intake. TTE and Zio monitor were ordered. She was referred to cardiology for further evaluation.  Palpitations: -Initial onset: 07-26-21. 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  He died on 11/09/21.  Prior to 07-26-22 she denies any palpitations.  -Frequency/Duration: In June and July her palpitations worsened. -Associated symptoms: Shortness of breath. Feels like her blood pressure is "a touch too low" but denies feeling presyncopal.  -Aggravating/alleviating factors: She initially attributed her palpitations to stress. Previously her stress usually manifested as GI issues. -Syncope/near syncope: None. -Prior cardiac history: none -Prior workup: We reviewed her CTA results at length. She is scheduled for her Echo next Friday at [facility]. Currently she is wearing the Zio monitor; she is on day 10 of 14. She confirms feeling palpitations while wearing the monitor. -Prior treatment: none -Possible medication interactions: She added magnesium 250 mg which helped initially, but seems to be less effective now.  -Caffeine: 1 cup daily -Alcohol: Occasional -Tobacco:  Never -OTC supplements: Supplements unchanged recently. -Comorbidities: Chronic lymphocytic leukemia -Exercise level: Previously she could walk 2-3 miles, now she  is only able to complete 1 mile. She also is unable to walk as fast, and she feels more fatigued. She is typically limited by shortness of breath first.  -Labs: TSH, kidney function/electrolytes, CBC reviewed. -Cardiac ROS: no chest pain, no PND, no orthopnea, no LE edema. -Family history: Her father had "terrible" heart disease, cardiomyopathy, known heavy smoker. Her identical twin does not have heart issues, she notes they usually parallel together. Her older brother had an early heart attack, and is hypertensive.  Of note, she denies any allergies to gadolinium. This was removed from her list of allergies today.  Past Medical History:  Diagnosis Date   Allergy    Hypercalciuria    Hyperuricosuria    Nephrolithiasis     Past Surgical History:  Procedure Laterality Date   ABDOMINAL HYSTERECTOMY     ADENOIDECTOMY     anterior cervicl cal fusion     TONSILLECTOMY     triger finger release Left     Current Medications: Current Outpatient Medications on File Prior to Visit  Medication Sig   amiloride-hydrochlorothiazide (MODURETIC) 5-50 MG tablet Take 1 tablet by mouth daily.   Cholecalciferol (VITAMIN D) 50 MCG (2000 UT) tablet Take 2,000 Units by mouth daily.   diphenhydrAMINE (BENADRYL) 50 MG capsule Take by mouth.   EPINEPHrine 0.3 mg/0.3 mL IJ SOAJ injection    estradiol (ESTRACE) 0.1 MG/GM vaginal cream Place 1 g vaginally once a week.   hydrOXYzine (ATARAX/VISTARIL) 25 MG tablet 1-2 every 6-8 hours for itching   Multiple Vitamins-Minerals (CENTRUM SILVER) tablet Centrum Silver  1 tablet by mouth once daily   Omega-3 Fatty Acids (OMEGA-3 2100) 1050 MG CAPS Omega 3  1280 mg - 3  capsules once daily   predniSONE (DELTASONE) 50 MG tablet Take by mouth.   No current facility-administered medications on file prior to visit.     Allergies:   Beef (bovine) protein and Contrast media [iodinated contrast media]   Social History   Tobacco Use   Smoking status: Never    Smokeless tobacco: Never  Substance Use Topics   Alcohol use: Never   Drug use: Never    Family History: family history includes Cancer in her maternal grandfather, maternal grandmother, paternal grandfather, and paternal grandmother; Non-Hodgkin's lymphoma in her brother; Uterine cancer in her mother.  ROS:   Please see the history of present illness.  Additional pertinent ROS: Constitutional: Negative for chills, fever, night sweats, unintentional weight loss  HENT: Negative for ear pain and hearing loss.   Eyes: Negative for loss of vision and eye pain.  Respiratory: Negative for cough, sputum, wheezing. Positive for shortness of breath.  Cardiovascular: See HPI. Gastrointestinal: Negative for abdominal pain, melena, and hematochezia.  Genitourinary: Negative for dysuria and hematuria.  Musculoskeletal: Negative for falls and myalgias.  Skin: Negative for itching and rash.  Neurological: Negative for focal weakness, focal sensory changes and loss of consciousness.  Endo/Heme/Allergies: Does not bruise/bleed easily.     EKGs/Labs/Other Studies Reviewed:    The following studies were reviewed today:  CTA Chest  01/27/2022: FINDINGS: Cardiovascular: The heart size is normal. No substantial pericardial effusion. Mild atherosclerotic calcification is noted in the wall of the thoracic aorta. There is no filling defect within the opacified pulmonary arteries to suggest the presence of an acute pulmonary embolus.   Mediastinum/Nodes: No mediastinal lymphadenopathy. There is no hilar lymphadenopathy. The esophagus has normal imaging features. There is no axillary lymphadenopathy.   Lungs/Pleura: Calcified granuloma noted right lower lobe. No suspicious pulmonary nodule or mass. No focal airspace consolidation. No pleural effusion.   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 3.1 cm left adrenal nodule is stable since prior  with attenuation too high to allow classification as an adenoma.   Musculoskeletal: No worrisome lytic or sclerotic osseous abnormality.   Review of the MIP images confirms the above findings.   IMPRESSION: 1. No CT evidence for acute pulmonary embolus. 2. 3.1 cm left adrenal nodule is stable since prior study with attenuation too high to allow classification as an adenoma. Interval stability is reassuring for benign etiology. Abdominal MRI recommended to further evaluate and potentially characterized as lipid poor adenoma. 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 4. Aortic Atherosclerosis (ICD10-I70.0).   EKG:  EKG is personally reviewed.   02/15/2022:  SR, one PVC, PRWP  Recent Labs: 11/13/2021: ALT 29; BUN 13; Creatinine, Ser 0.62; Hemoglobin 16.5; Platelets 272; Potassium 3.8; Sodium 134   Recent Lipid Panel No results found for: "CHOL", "TRIG", "HDL", "CHOLHDL", "VLDL", "LDLCALC", "LDLDIRECT"  Physical Exam:    VS:  BP 114/64 (BP Location: Right Arm, Patient Position: Sitting, Cuff Size: Normal)   Pulse 94   Ht '5\' 6"'$  (1.676 m)   Wt 141 lb 4.8 oz (64.1 kg)   BMI 22.81 kg/m     Wt Readings from Last 3 Encounters:  02/15/22 141 lb 4.8 oz (64.1 kg)  11/13/21 136 lb 12.8 oz (62.1 kg)  04/07/21 140 lb (63.5 kg)    GEN: Well nourished, well developed in no acute distress HEENT: Normal, moist mucous membranes NECK: No JVD CARDIAC: regular rhythm with one PVC in 20 beats, normal S1  and S2, no rubs or gallops.  VASCULAR: Radial and DP pulses 2+ bilaterally. No carotid bruits RESPIRATORY:  Clear to auscultation without rales, wheezing or rhonchi  ABDOMEN: Soft, non-tender, non-distended MUSCULOSKELETAL:  Ambulates independently SKIN: Warm and dry, no edema NEUROLOGIC:  Alert and oriented x 3. No focal neuro deficits noted. PSYCHIATRIC:  Normal affect    ASSESSMENT:    1. Palpitations   2. PVC (premature ventricular contraction)   3. Shortness of breath   4.  Cardiac risk counseling   5. Essential hypertension    PLAN:    Palpitations PVCs -currently wearing monitor, echo is pending -will schedule close follow up to review results -reviewed red flag signs that need immediate medical attention  Shortness of breath -CTPE without PE. Not gated to evaluate coronaries -if monitor and echo unrevealing for cause, we discussed ETT to get evaluation of BP/HR/ectopy with activity  Hypertension -at goal on amiloride-HCTZ  Cardiac risk counseling and prevention recommendations: -recommend heart healthy/Mediterranean diet, with whole grains, fruits, vegetable, fish, lean meats, nuts, and olive oil. Limit salt. -recommend moderate walking, 3-5 times/week for 30-50 minutes each session. Aim for at least 150 minutes.week. Goal should be pace of 3 miles/hours, or walking 1.5 miles in 30 minutes -recommend avoidance of tobacco products. Avoid excess alcohol.  Plan for follow up: 3-4 weeks or sooner as needed.  Buford Dresser, MD, PhD, [facility]    Medication Adjustments/Labs and Tests Ordered: Current medicines are reviewed at length with the patient today.  Concerns regarding medicines are outlined above.   Orders Placed This Encounter  Procedures   EKG 12-Lead   No orders of the defined types were placed in this encounter.  Patient Instructions  Medication Instructions:  Your Physician recommend you continue on your current medication as directed.    *If you need a refill on your cardiac medications before your next appointment, please call your pharmacy*   Lab Work: None ordered today   Testing/Procedures: None ordered today   Follow-Up: At [facility], you and your health needs are our priority.  As part of our continuing mission to provide you with exceptional heart care, we have created designated Provider Care Teams.  These Care Teams include your primary Cardiologist (physician) and  Advanced Practice Providers (APPs -  Physician Assistants and Nurse Practitioners) who all work together to provide you with the care you need, when you need it.  We recommend signing up for the patient portal called "MyChart".  Sign up information is provided on this After Visit Summary.  MyChart is used to connect with patients for Virtual Visits (Telemedicine).  Patients are able to view lab/test results, encounter notes, upcoming appointments, etc.  Non-urgent messages can be sent to your provider as well.   To learn more about what you can do with MyChart, go to [email or web address].    Your next appointment:   3-4 week(s)  The format for your next appointment:   In Person  Provider:   Buford Dresser, MD             [facility] Stump[facility],acting as a scribe for Buford Dresser, MD.,have documented all relevant documentation on the behalf of Buford Dresser, MD,as directed by  Buford Dresser, MD while in the presence of Buford Dresser, MD.  I, Buford Dresser, MD, have reviewed all documentation for this visit. The documentation on 02/15/22 for the exam, diagnosis, procedures, and orders are all accurate and complete.   Signed, Buford Dresser, MD  PhD 02/15/2022 6:25 PM    [place]

## 2022-03-14 ENCOUNTER — Ambulatory Visit (HOSPITAL_BASED_OUTPATIENT_CLINIC_OR_DEPARTMENT_OTHER): Payer: Medicare Other | Admitting: Cardiology

## 2022-03-15 ENCOUNTER — Encounter (HOSPITAL_BASED_OUTPATIENT_CLINIC_OR_DEPARTMENT_OTHER): Payer: Self-pay

## 2022-03-15 NOTE — Telephone Encounter (Signed)
Patient test results

## 2022-03-30 NOTE — Telephone Encounter (Signed)
Results printed to be scanned into medical record.   Loel Dubonnet, NP

## 2022-04-02 ENCOUNTER — Encounter (HOSPITAL_BASED_OUTPATIENT_CLINIC_OR_DEPARTMENT_OTHER): Payer: Self-pay

## 2022-04-11 ENCOUNTER — Encounter (HOSPITAL_BASED_OUTPATIENT_CLINIC_OR_DEPARTMENT_OTHER): Payer: Self-pay | Admitting: Cardiology

## 2022-04-11 ENCOUNTER — Ambulatory Visit (INDEPENDENT_AMBULATORY_CARE_PROVIDER_SITE_OTHER): Payer: Medicare Other | Admitting: Cardiology

## 2022-04-11 VITALS — BP 116/72 | HR 84 | Ht 66.0 in | Wt 141.7 lb

## 2022-04-11 DIAGNOSIS — I10 Essential (primary) hypertension: Secondary | ICD-10-CM | POA: Diagnosis not present

## 2022-04-11 DIAGNOSIS — R002 Palpitations: Secondary | ICD-10-CM | POA: Diagnosis not present

## 2022-04-11 DIAGNOSIS — Z712 Person consulting for explanation of examination or test findings: Secondary | ICD-10-CM | POA: Diagnosis not present

## 2022-04-11 DIAGNOSIS — I493 Ventricular premature depolarization: Secondary | ICD-10-CM

## 2022-04-11 NOTE — Progress Notes (Signed)
Cardiology Office Note:    Date:  04/11/2022   ID:  Erica Barton, DOB 23-Feb-1951, MRN [id number]  PCP:  Natividad Brood, MD  Cardiologist:  Buford Dresser, MD  Referring MD: Natividad Brood, MD   CC: follow up  History of Present Illness:    Erica Barton is a 71 y.o. female with a hx of nephrolithiasis, hyperuricosuria, and hypercalciuria, who is seen in follow-up today. She was initially seen 02/15/2022 as a new consult at the request of Natividad Brood, MD for the evaluation and management of palpitations.  Family history: Her father had "terrible" heart disease, cardiomyopathy, known heavy smoker. Her identical twin does not have heart issues, she notes they usually parallel together. Her older brother had an early heart attack, and is hypertensive.  Today: Compared with her last visit, she is feeling better. She states that her ectopic beats "have quieted down considerably." Her shortness of breath has improved significantly, as she reports only 1-2 dyspneic episodes. Although she is still under pressure, her stress level has decreased quite a bit overall.  She is struggling with frequent diarrhea, so she stopped taking her magnesium.  We reviewed her monitor results and her echo report from [facility] today.  For activity she is now able to walk a couple miles daily. She is practicing step exercises to target her quadriceps. She has noticed her legs do not feel as fatigued since her surgeries. Her swelling has also improved.  She denies any chest pain, lightheadedness, headaches, syncope, orthopnea, or PND.   Past Medical History:  Diagnosis Date   Allergy    Hypercalciuria    Hyperuricosuria    Nephrolithiasis     Past Surgical History:  Procedure Laterality Date   ABDOMINAL HYSTERECTOMY     ADENOIDECTOMY     anterior cervicl cal fusion     TONSILLECTOMY     triger finger release Left     Current Medications: Current Outpatient Medications on File Prior to Visit   Medication Sig   amiloride-hydrochlorothiazide (MODURETIC) 5-50 MG tablet Take 1 tablet by mouth daily.   Cholecalciferol (VITAMIN D) 50 MCG (2000 UT) tablet Take 2,000 Units by mouth daily.   diphenhydrAMINE (BENADRYL) 50 MG capsule Take by mouth.   EPINEPHrine 0.3 mg/0.3 mL IJ SOAJ injection    estradiol (ESTRACE) 0.1 MG/GM vaginal cream Place 1 g vaginally once a week.   hydrOXYzine (ATARAX/VISTARIL) 25 MG tablet 1-2 every 6-8 hours for itching   Multiple Vitamins-Minerals (CENTRUM SILVER) tablet Centrum Silver  1 tablet by mouth once daily   Omega-3 Fatty Acids (OMEGA-3 2100) 1050 MG CAPS Omega 3  1280 mg - 3 capsules once daily   predniSONE (DELTASONE) 50 MG tablet Take by mouth.   No current facility-administered medications on file prior to visit.     Allergies:   Beef (bovine) protein and Contrast media [iodinated contrast media]   Social History   Tobacco Use   Smoking status: Never   Smokeless tobacco: Never  Substance Use Topics   Alcohol use: Never   Drug use: Never    Family History: family history includes Cancer in her maternal grandfather, maternal grandmother, paternal grandfather, and paternal grandmother; Non-Hodgkin's lymphoma in her brother; Uterine cancer in her mother.  ROS:   Please see the history of present illness. (+) Diarrhea (+) Palpitations (+) Shortness of breath (+) Stress All other systems are reviewed and negative.   EKGs/Labs/Other Studies Reviewed:    The following studies were reviewed today:  Echo  02/23/2022  (  [place]) Summary    1. The left ventricle is normal in size with normal wall thickness.    2. The left ventricular systolic function is normal, LVEF is visually  estimated at > 55%.    3. The right ventricle is normal in size, with normal systolic function.   Monitor  02/2022: Patch Wear Time:  13 days and 21 hours   Patient had a min HR of 53 bpm, max HR of 182 bpm, and avg HR of 81 bpm. Predominant underlying  rhythm was Sinus Rhythm. 6 Supraventricular Tachycardia runs occurred, the run with the fastest interval lasting 5 beats with a max rate of 182 bpm, the longest lasting 11 beats with an avg rate of 148 bpm. Isolated SVEs were rare (<1.0%), Isolated VEs were occasional (3.8%). Ventricular Bigeminy and Trigeminy were present. No VT, atrial fibrillation, high degree block, or pauses noted. There were 53 triggered events. These were sinus with ectopy.  CTA Chest  01/27/2022: FINDINGS: Cardiovascular: The heart size is normal. No substantial pericardial effusion. Mild atherosclerotic calcification is noted in the wall of the thoracic aorta. There is no filling defect within the opacified pulmonary arteries to suggest the presence of an acute pulmonary embolus.   IMPRESSION: 1. No CT evidence for acute pulmonary embolus. 2. 3.1 cm left adrenal nodule is stable since prior study with attenuation too high to allow classification as an adenoma. Interval stability is reassuring for benign etiology. Abdominal MRI recommended to further evaluate and potentially characterized as lipid poor adenoma. 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 4. Aortic Atherosclerosis (ICD10-I70.0).  CT Calcium Score  04/13/2021: Findings: Left Anterior Descending: 8.8  The total coronary calcium score is 8.8.  Thoracic aorta size Mid Ascending: 3.4 cm  Mid Descending: 2.4 cm  Non-coronary findings: The visualized heart is normal in size without pericardial effusion. No significant mediastinal or hilar lymphadenopathy. The main pulmonary arteries normal in course and caliber. The visualized lung fields appear within normal limits. The visualized upper abdomen is unremarkable. No aggressive osseous lesions.  Impression: 1. There is a very small amount of identificable coronary artery plaque (score of 1-10) Significant coronary artery disease is unlikely. 2. the patient is recommended to maintain a healthy  lifestyle. Maintenance of an ideal body weight, following a blanaced diet, and avoidance of smoking are recommended.  EKG:  EKG is personally reviewed.   04/11/2022:  not ordered today 02/15/2022:  SR, one PVC, PRWP  Recent Labs: 11/13/2021: ALT 29; BUN 13; Creatinine, Ser 0.62; Hemoglobin 16.5; Platelets 272; Potassium 3.8; Sodium 134   Recent Lipid Panel No results found for: "CHOL", "TRIG", "HDL", "CHOLHDL", "VLDL", "LDLCALC", "LDLDIRECT"  Physical Exam:    VS:  BP 116/72 (BP Location: Right Arm, Patient Position: Sitting, Cuff Size: Normal)   Pulse 84   Ht '5\' 6"'$  (1.676 m)   Wt 141 lb 11.2 oz (64.3 kg)   SpO2 98%   BMI 22.87 kg/m     Wt Readings from Last 3 Encounters:  04/11/22 141 lb 11.2 oz (64.3 kg)  02/15/22 141 lb 4.8 oz (64.1 kg)  11/13/21 136 lb 12.8 oz (62.1 kg)    GEN: Well nourished, well developed in no acute distress HEENT: Normal, moist mucous membranes NECK: No JVD CARDIAC: regular rhythm without appreciated ectopy, normal S1 and S2, no rubs or gallops.  VASCULAR: Radial and DP pulses 2+ bilaterally. No carotid bruits RESPIRATORY:  Clear to auscultation without rales, wheezing or rhonchi  ABDOMEN: Soft, non-tender,  non-distended MUSCULOSKELETAL:  Ambulates independently SKIN: Warm and dry, no edema NEUROLOGIC:  Alert and oriented x 3. No focal neuro deficits noted. PSYCHIATRIC:  Normal affect    ASSESSMENT:    1. Encounter to discuss test results   2. Palpitations   3. PVC (premature ventricular contraction)   4. Essential hypertension     PLAN:    Palpitations PVCs -symptoms improving. PVC burden ~3.8% on monitor, rare/brief pSVT -reviewed red flag signs that need immediate medical attention  Shortness of breath -CTPE without PE. Not gated to evaluate coronaries -echo unremarkable -symptoms improving. If worsen again, consider ETT  Hypertension -at goal on amiloride-HCTZ  Cardiac risk counseling and prevention recommendations: -recommend  heart healthy/Mediterranean diet, with whole grains, fruits, vegetable, fish, lean meats, nuts, and olive oil. Limit salt. -recommend moderate walking, 3-5 times/week for 30-50 minutes each session. Aim for at least 150 minutes.week. Goal should be pace of 3 miles/hours, or walking 1.5 miles in 30 minutes -recommend avoidance of tobacco products. Avoid excess alcohol.  Plan for follow up: 1 year or sooner as needed.  Buford Dresser, MD, PhD, [facility]    Medication Adjustments/Labs and Tests Ordered: Current medicines are reviewed at length with the patient today.  Concerns regarding medicines are outlined above.   No orders of the defined types were placed in this encounter.  No orders of the defined types were placed in this encounter.  Patient Instructions  Medication Instructions:  Your Physician recommend you continue on your current medication as directed.    *If you need a refill on your cardiac medications before your next appointment, please call your pharmacy*   Lab Work: None ordered today   Testing/Procedures: None ordered today   Follow-Up: At [facility], you and your health needs are our priority.  As part of our continuing mission to provide you with exceptional heart care, we have created designated Provider Care Teams.  These Care Teams include your primary Cardiologist (physician) and Advanced Practice Providers (APPs -  Physician Assistants and Nurse Practitioners) who all work together to provide you with the care you need, when you need it.  We recommend signing up for the patient portal called "MyChart".  Sign up information is provided on this After Visit Summary.  MyChart is used to connect with patients for Virtual Visits (Telemedicine).  Patients are able to view lab/test results, encounter notes, upcoming appointments, etc.  Non-urgent messages can be sent to your provider as well.   To learn more about what you  can do with MyChart, go to [email or web address].    Your next appointment:   1 year(s)  The format for your next appointment:   In Person  Provider:   Buford Dresser, MD           [facility] Stumpf,acting as a scribe for Buford Dresser, MD.,have documented all relevant documentation on the behalf of Buford Dresser, MD,as directed by  Buford Dresser, MD while in the presence of Buford Dresser, MD.  I, Buford Dresser, MD, have reviewed all documentation for this visit. The documentation on 04/11/22 for the exam, diagnosis, procedures, and orders are all accurate and complete.   Signed, Buford Dresser, MD PhD 04/11/2022 1:24 PM    [facility]

## 2022-04-11 NOTE — Patient Instructions (Signed)
Medication Instructions:  Your Physician recommend you continue on your current medication as directed.    *If you need a refill on your cardiac medications before your next appointment, please call your pharmacy*   Lab Work: None ordered today   Testing/Procedures: None ordered today   Follow-Up: At [facility], you and your health needs are our priority.  As part of our continuing mission to provide you with exceptional heart care, we have created designated Provider Care Teams.  These Care Teams include your primary Cardiologist (physician) and Advanced Practice Providers (APPs -  Physician Assistants and Nurse Practitioners) who all work together to provide you with the care you need, when you need it.  We recommend signing up for the patient portal called "MyChart".  Sign up information is provided on this After Visit Summary.  MyChart is used to connect with patients for Virtual Visits (Telemedicine).  Patients are able to view lab/test results, encounter notes, upcoming appointments, etc.  Non-urgent messages can be sent to your provider as well.   To learn more about what you can do with MyChart, go to [email or web address].    Your next appointment:   1 year(s)  The format for your next appointment:   In Person  Provider:   Bridgette Christopher, MD          

## 2022-05-15 ENCOUNTER — Inpatient Hospital Stay: Payer: Medicare Other

## 2022-05-15 ENCOUNTER — Inpatient Hospital Stay: Payer: Medicare Other | Attending: Internal Medicine | Admitting: Internal Medicine

## 2022-05-15 VITALS — BP 120/81 | HR 71 | Temp 98.3°F | Resp 16 | Wt 138.8 lb

## 2022-05-15 DIAGNOSIS — Z79899 Other long term (current) drug therapy: Secondary | ICD-10-CM | POA: Insufficient documentation

## 2022-05-15 DIAGNOSIS — D47Z9 Other specified neoplasms of uncertain behavior of lymphoid, hematopoietic and related tissue: Secondary | ICD-10-CM

## 2022-05-15 DIAGNOSIS — D72829 Elevated white blood cell count, unspecified: Secondary | ICD-10-CM | POA: Insufficient documentation

## 2022-05-15 DIAGNOSIS — C9110 Chronic lymphocytic leukemia of B-cell type not having achieved remission: Secondary | ICD-10-CM | POA: Diagnosis present

## 2022-05-15 DIAGNOSIS — C8310 Mantle cell lymphoma, unspecified site: Secondary | ICD-10-CM | POA: Insufficient documentation

## 2022-05-15 DIAGNOSIS — Z91041 Radiographic dye allergy status: Secondary | ICD-10-CM | POA: Insufficient documentation

## 2022-05-15 DIAGNOSIS — D72820 Lymphocytosis (symptomatic): Secondary | ICD-10-CM

## 2022-05-15 LAB — CBC WITH DIFFERENTIAL/PLATELET
Abs Immature Granulocytes: 0.04 10*3/uL (ref 0.00–0.07)
Basophils Absolute: 0.1 10*3/uL (ref 0.0–0.1)
Basophils Relative: 0 %
Eosinophils Absolute: 0.1 10*3/uL (ref 0.0–0.5)
Eosinophils Relative: 1 %
HCT: 47.6 % — ABNORMAL HIGH (ref 36.0–46.0)
Hemoglobin: 16.5 g/dL — ABNORMAL HIGH (ref 12.0–15.0)
Immature Granulocytes: 0 %
Lymphocytes Relative: 76 %
Lymphs Abs: 17.7 10*3/uL — ABNORMAL HIGH (ref 0.7–4.0)
MCH: 32.1 pg (ref 26.0–34.0)
MCHC: 34.7 g/dL (ref 30.0–36.0)
MCV: 92.6 fL (ref 80.0–100.0)
Monocytes Absolute: 0.6 10*3/uL (ref 0.1–1.0)
Monocytes Relative: 3 %
Neutro Abs: 4.6 10*3/uL (ref 1.7–7.7)
Neutrophils Relative %: 20 %
Platelets: 250 10*3/uL (ref 150–400)
RBC: 5.14 MIL/uL — ABNORMAL HIGH (ref 3.87–5.11)
RDW: 13.1 % (ref 11.5–15.5)
Smear Review: NORMAL
WBC: 23.2 10*3/uL — ABNORMAL HIGH (ref 4.0–10.5)
nRBC: 0 % (ref 0.0–0.2)

## 2022-05-15 LAB — COMPREHENSIVE METABOLIC PANEL
ALT: 24 U/L (ref 0–44)
AST: 21 U/L (ref 15–41)
Albumin: 3.9 g/dL (ref 3.5–5.0)
Alkaline Phosphatase: 63 U/L (ref 38–126)
Anion gap: 9 (ref 5–15)
BUN: 16 mg/dL (ref 8–23)
CO2: 27 mmol/L (ref 22–32)
Calcium: 9.2 mg/dL (ref 8.9–10.3)
Chloride: 102 mmol/L (ref 98–111)
Creatinine, Ser: 0.6 mg/dL (ref 0.44–1.00)
GFR, Estimated: >60 mL/min (ref >60)
Glucose, Bld: 83 mg/dL (ref 70–99)
Potassium: 3.6 mmol/L (ref 3.5–5.1)
Sodium: 138 mmol/L (ref 135–145)
Total Bilirubin: 0.6 mg/dL (ref 0.3–1.2)
Total Protein: 6.8 g/dL (ref 6.5–8.1)

## 2022-05-15 LAB — LACTATE DEHYDROGENASE: LDH: 133 U/L (ref 98–192)

## 2022-05-15 NOTE — Assessment & Plan Note (Addendum)
#  SEP 2022- WBC 15; ALC 12; Hb-16; platlets normal.  B-cell low-grade lymphoproliferative disorder.   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  DEC 2022-  No evidence of lymphadenopathy in the chest, abdomen, or pelvis;  Normal spleen;  There is a 3.2 x 1.8 cm soft tissue attenuation nodule of the left adrenal gland, most likely a benign, incidental adrenal adenoma. [2019-[facility] imaging- STABLE ].  # However today- Hb 23; ALC- 17; Hb- 16; platelets- N; LDH- WNL; Discussed that as patient is asymptomatic at this time-would recommend holding off any bone marrow biopsy; unless develops any signs and symptoms of lymphoma. Discussed that she might need she will need a definitive process prior to treatment.  Discussed the treatment options include-IV infusions like rituximab vs- TKI/pills based on final diagnosis.  For now continue surveillance.  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 monitor for now.  Low clinical concerns for thromboembolic events at this time.  Monitor for now.  # Vaccination: s/p Flu shot/covid/ RSV-s/p pneumonia vaccination.  Discussed regarding infectious precautions in general.  # DISPOSITION: # follow up in 6 months- MD; labs- cbc/cmp;LDH-  Dr.B

## 2022-05-15 NOTE — Progress Notes (Signed)
Patient denies new problems/concerns today.   °

## 2022-05-15 NOTE — Progress Notes (Signed)
[place] OFFICE PROGRESS NOTE  Patient Care Team: Natividad Brood, MD as PCP - General (Family Medicine) Buford Dresser, MD as PCP - Cardiology (Cardiology) [name], MD as Consulting Physician (Oncology)   # HEMATOLOGY HISTORY:  # LEUCOCYTOSIS- WBC-; N; L; Hb- platelets   Oncology History   No history exists.      INTERVAL HISTORY: Ambulating independently.  Alone.  Erica Barton 71 y.o.  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  Patient denies new problems/concerns today.  However she denies any significant weight loss.  Denies any night sweats.  Denies any nausea vomiting.  No new lumps or bumps.    Review of Systems  Constitutional:  Negative for chills, diaphoresis, fever, malaise/fatigue and weight loss.  HENT:  Negative for nosebleeds and sore throat.   Eyes:  Negative for double vision.  Respiratory:  Negative for cough, hemoptysis, sputum production, shortness of breath and wheezing.   Cardiovascular:  Negative for chest pain, palpitations, orthopnea and leg swelling.  Gastrointestinal:  Negative for abdominal pain, blood in stool, constipation, diarrhea, heartburn, melena, nausea and vomiting.  Genitourinary:  Negative for dysuria, frequency and urgency.  Musculoskeletal:  Negative for back pain and joint pain.  Skin: Negative.  Negative for itching and rash.  Neurological:  Negative for dizziness, tingling, focal weakness, weakness and headaches.  Endo/Heme/Allergies:  Does not bruise/bleed easily.  Psychiatric/Behavioral:  Negative for depression. The patient is not nervous/anxious and does not have insomnia.       PAST MEDICAL HISTORY :  Past Medical History:  Diagnosis Date   Allergy    Hypercalciuria    Hyperuricosuria    Nephrolithiasis     PAST SURGICAL HISTORY :   Past Surgical History:  Procedure Laterality Date    ABDOMINAL HYSTERECTOMY     ADENOIDECTOMY     anterior cervicl cal fusion     TONSILLECTOMY     triger finger release Left     FAMILY HISTORY :   Family History  Problem Relation Age of Onset   Uterine cancer Mother    Non-Hodgkin's lymphoma Brother    Cancer Maternal Grandmother        unknown   Cancer Maternal Grandfather        unknown   Cancer Paternal Grandmother        unknown   Cancer Paternal Grandfather        unknown    SOCIAL HISTORY:   Social History   Tobacco Use   Smoking status: Never   Smokeless tobacco: Never  Substance Use Topics   Alcohol use: Never   Drug use: Never    ALLERGIES:  is allergic to beef (bovine) protein and contrast media [iodinated contrast media].  MEDICATIONS:  Current Outpatient Medications  Medication Sig Dispense Refill   amiloride-hydrochlorothiazide (MODURETIC) 5-50 MG tablet Take 1 tablet by mouth daily.     Cholecalciferol (VITAMIN D) 50 MCG (2000 UT) tablet Take 2,000 Units by mouth daily.     diphenhydrAMINE (BENADRYL) 50 MG capsule Take by mouth.     EPINEPHrine 0.3 mg/0.3 mL IJ SOAJ injection      estradiol (ESTRACE) 0.1 MG/GM vaginal cream Place 1 g vaginally once a week.     Multiple Vitamins-Minerals (CENTRUM SILVER) tablet Centrum Silver  1 tablet by mouth once daily     Omega-3 Fatty Acids (OMEGA-3 2100) 1050 MG CAPS Omega 3  1280 mg -  3 capsules once daily     predniSONE (DELTASONE) 50 MG tablet Take by mouth as needed.     hydrOXYzine (ATARAX/VISTARIL) 25 MG tablet 1-2 every 6-8 hours for itching     No current facility-administered medications for this visit.    PHYSICAL EXAMINATION:  BP 120/81 (BP Location: Left Arm, Patient Position: Sitting)   Pulse 71   Temp 98.3 F (36.8 C) (Tympanic)   Resp 16   Wt 138 lb 12.8 oz (63 kg)   BMI 22.40 kg/m   Filed Weights   05/15/22 1300  Weight: 138 lb 12.8 oz (63 kg)    Physical Exam Vitals and nursing note reviewed.  HENT:     Head: Normocephalic and  atraumatic.     Mouth/Throat:     Pharynx: Oropharynx is clear.  Eyes:     Extraocular Movements: Extraocular movements intact.     Pupils: Pupils are equal, round, and reactive to light.  Cardiovascular:     Rate and Rhythm: Normal rate and regular rhythm.  Pulmonary:     Comments: Decreased breath sounds bilaterally.  Abdominal:     Palpations: Abdomen is soft.  Musculoskeletal:        General: Normal range of motion.     Cervical back: Normal range of motion.  Skin:    General: Skin is warm.  Neurological:     General: No focal deficit present.     Mental Status: She is alert and oriented to person, place, and time.  Psychiatric:        Behavior: Behavior normal.        Judgment: Judgment normal.        LABORATORY DATA:  I have reviewed the data as listed    Component Value Date/Time   NA 138 05/15/2022 1228   K 3.6 05/15/2022 1228   CL 102 05/15/2022 1228   CO2 27 05/15/2022 1228   GLUCOSE 83 05/15/2022 1228   BUN 16 05/15/2022 1228   CREATININE 0.60 05/15/2022 1228   CALCIUM 9.2 05/15/2022 1228   PROT 6.8 05/15/2022 1228   ALBUMIN 3.9 05/15/2022 1228   AST 21 05/15/2022 1228   ALT 24 05/15/2022 1228   ALKPHOS 63 05/15/2022 1228   BILITOT 0.6 05/15/2022 1228   GFRNONAA >60 05/15/2022 1228    No results found for: "SPEP", "UPEP"  Lab Results  Component Value Date   WBC 23.2 (H) 05/15/2022   NEUTROABS 4.6 05/15/2022   HGB 16.5 (H) 05/15/2022   HCT 47.6 (H) 05/15/2022   MCV 92.6 05/15/2022   PLT 250 05/15/2022      Chemistry      Component Value Date/Time   NA 138 05/15/2022 1228   K 3.6 05/15/2022 1228   CL 102 05/15/2022 1228   CO2 27 05/15/2022 1228   BUN 16 05/15/2022 1228   CREATININE 0.60 05/15/2022 1228      Component Value Date/Time   CALCIUM 9.2 05/15/2022 1228   ALKPHOS 63 05/15/2022 1228   AST 21 05/15/2022 1228   ALT 24 05/15/2022 1228   BILITOT 0.6 05/15/2022 1228       RADIOGRAPHIC STUDIES: I have personally reviewed  the radiological images as listed and agreed with the findings in the report. No results found.   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  B-cell low-grade lymphoproliferative disorder.   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  DEC 2022-  No evidence of lymphadenopathy in the chest, abdomen, or pelvis;  Normal spleen;  There is a 3.2 x 1.8 cm soft tissue attenuation nodule of the left adrenal gland, most likely a benign, incidental adrenal adenoma. [2019-[facility]- STABLE. ].   #Discussed that as patient is asymptomatic at this time-would recommend holding off any bone marrow biopsy; unless develops any signs and symptoms of lymphoma.  Or significant concerns noted on imaging.  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 monitor for now.  Low clinical concerns for thromboembolic events at this time.  Monitor for now.  # DISPOSITION: # follow up in 6 months- MD; labs- cbc/cmp;LDH Dr.B   Orders Placed This Encounter  Procedures   CBC with Differential/Platelet    Standing Status:   Future    Standing Expiration Date:   05/15/2023   Comprehensive metabolic panel    Standing Status:   Future    Standing Expiration Date:   05/15/2023   Lactate dehydrogenase    Standing Status:   Future    Standing Expiration Date:   05/16/2023    All questions were answered. The patient knows to call the clinic with any problems, questions or concerns.      [name], MD 05/15/2022 1:36 PM

## 2022-10-03 ENCOUNTER — Other Ambulatory Visit: Payer: Self-pay | Admitting: *Deleted

## 2022-10-03 DIAGNOSIS — D47Z9 Other specified neoplasms of uncertain behavior of lymphoid, hematopoietic and related tissue: Secondary | ICD-10-CM

## 2022-10-19 ENCOUNTER — Inpatient Hospital Stay (HOSPITAL_BASED_OUTPATIENT_CLINIC_OR_DEPARTMENT_OTHER): Payer: Medicare Other | Admitting: Internal Medicine

## 2022-10-19 ENCOUNTER — Encounter: Payer: Self-pay | Admitting: Internal Medicine

## 2022-10-19 ENCOUNTER — Inpatient Hospital Stay: Payer: Medicare Other | Attending: Internal Medicine

## 2022-10-19 VITALS — BP 121/85 | HR 79 | Temp 97.4°F | Ht 66.0 in | Wt 130.0 lb

## 2022-10-19 DIAGNOSIS — R030 Elevated blood-pressure reading, without diagnosis of hypertension: Secondary | ICD-10-CM | POA: Diagnosis not present

## 2022-10-19 DIAGNOSIS — C4359 Malignant melanoma of other part of trunk: Secondary | ICD-10-CM | POA: Insufficient documentation

## 2022-10-19 DIAGNOSIS — D47Z9 Other specified neoplasms of uncertain behavior of lymphoid, hematopoietic and related tissue: Secondary | ICD-10-CM

## 2022-10-19 DIAGNOSIS — E278 Other specified disorders of adrenal gland: Secondary | ICD-10-CM | POA: Diagnosis not present

## 2022-10-19 DIAGNOSIS — Z79899 Other long term (current) drug therapy: Secondary | ICD-10-CM | POA: Insufficient documentation

## 2022-10-19 DIAGNOSIS — C9110 Chronic lymphocytic leukemia of B-cell type not having achieved remission: Secondary | ICD-10-CM | POA: Insufficient documentation

## 2022-10-19 DIAGNOSIS — I700 Atherosclerosis of aorta: Secondary | ICD-10-CM | POA: Diagnosis not present

## 2022-10-19 LAB — CBC WITH DIFFERENTIAL (CANCER CENTER ONLY)
Abs Immature Granulocytes: 0.06 10*3/uL (ref 0.00–0.07)
Basophils Absolute: 0.1 10*3/uL (ref 0.0–0.1)
Basophils Relative: 0 %
Eosinophils Absolute: 0.1 10*3/uL (ref 0.0–0.5)
Eosinophils Relative: 1 %
HCT: 48.8 % — ABNORMAL HIGH (ref 36.0–46.0)
Hemoglobin: 15.9 g/dL — ABNORMAL HIGH (ref 12.0–15.0)
Immature Granulocytes: 0 %
Lymphocytes Relative: 79 %
Lymphs Abs: 19.7 10*3/uL — ABNORMAL HIGH (ref 0.7–4.0)
MCH: 30.5 pg (ref 26.0–34.0)
MCHC: 32.6 g/dL (ref 30.0–36.0)
MCV: 93.7 fL (ref 80.0–100.0)
Monocytes Absolute: 0.6 10*3/uL (ref 0.1–1.0)
Monocytes Relative: 2 %
Neutro Abs: 4.4 10*3/uL (ref 1.7–7.7)
Neutrophils Relative %: 18 %
Platelet Count: 257 10*3/uL (ref 150–400)
RBC: 5.21 MIL/uL — ABNORMAL HIGH (ref 3.87–5.11)
RDW: 14.4 % (ref 11.5–15.5)
Smear Review: NORMAL
WBC Count: 25 10*3/uL — ABNORMAL HIGH (ref 4.0–10.5)
nRBC: 0 % (ref 0.0–0.2)

## 2022-10-19 LAB — CMP (CANCER CENTER ONLY)
ALT: 23 U/L (ref 0–44)
AST: 22 U/L (ref 15–41)
Albumin: 3.6 g/dL (ref 3.5–5.0)
Alkaline Phosphatase: 56 U/L (ref 38–126)
Anion gap: 11 (ref 5–15)
BUN: 17 mg/dL (ref 8–23)
CO2: 26 mmol/L (ref 22–32)
Calcium: 9.6 mg/dL (ref 8.9–10.3)
Chloride: 99 mmol/L (ref 98–111)
Creatinine: 0.71 mg/dL (ref 0.44–1.00)
GFR, Estimated: >60 mL/min (ref >60)
Glucose, Bld: 95 mg/dL (ref 70–99)
Potassium: 3.6 mmol/L (ref 3.5–5.1)
Sodium: 136 mmol/L (ref 135–145)
Total Bilirubin: 0.7 mg/dL (ref 0.3–1.2)
Total Protein: 6.7 g/dL (ref 6.5–8.1)

## 2022-10-19 LAB — LACTATE DEHYDROGENASE: LDH: 125 U/L (ref 98–192)

## 2022-10-19 NOTE — Assessment & Plan Note (Addendum)
#   SEP 2022- WBC 15; ALC 12; Hb-16; platlets normal.  B-cell low-grade lymphoproliferative disorder.   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  DEC 2022-  No evidence of lymphadenopathy in the chest, abdomen, or pelvis;  Normal spleen;  There is a 3.2 x 1.8 cm soft tissue attenuation nodule of the left adrenal gland, most likely a benign, incidental adrenal adenoma. [2019-[facility]- stable]. AUG 2023- CT A [PCP]- no LN; see below.  No bone marrow biopsy.  # However today- Hb 25 ; ALC- 19; Hb- 15.9 platelets- N; LDH- WNL; given absence of any ongoing obvious symptoms related to lymphoproliferative disorder I think is reasonable to continue his surveillance.  # Left flank: PT1a superficial spreading melanoma-April 18th, 2024 [Dr.Dasher] clark levels III- Breslow-0.4 mm; no ulceration/perineural invasion/lymphovascular invasion/0 mitoses; on April 23rd, 2024 [Dr.Dasher]- wide excision margin clear-as per patient.  Requested records from Dr. Durene Cal office.  Recommend continued surveillance with dermatology.  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 monitor for now.  Low clinical concerns for thromboembolic events at this time.  Monitor for now.  # AUG 2023- [PCP; Dr.Lambert]-  3.1 cm left adrenal nodule is stable since prior study with attenuation too high to allow classification as an adenoma. Interval stability is reassuring for benign etiology. Abdominal MRI recommended to further evaluate and potentially characterized as lipid poor adenoma. Will order MRI today.   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   #  Vaccination: s/p Flu shot/covid/ RSV-s/p pneumonia vaccination.  Again discussed to avoid any live vaccines.  Discussed and counseled regarding infectious precautions in general.  #  DISPOSITION: # MRI abdomen # cancel the appt in June-  # follow up in 6 months- MD; labs- cbc/cmp;LDH-  Dr.B  # I reviewed the blood work- with the patient in detail; also reviewed the imaging independently [as summarized above]; and with the patient in detail.    Dr.Lambert; Dr.Dasher-

## 2022-10-19 NOTE — Progress Notes (Signed)
Pt states she is not hungry like she use to be. But likes the weight she is at.   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 Has stopped meat/dairy and has made a big improvement in overall health.  Discuss melanoma dx

## 2022-10-19 NOTE — Progress Notes (Signed)
I connected with Erica Barton on 10/19/22 at 10:15 AM EDT by video enabled telemedicine visit and verified that I am speaking with the correct person using two identifiers.  I discussed the limitations, risks, security and privacy concerns of performing an evaluation and management service by telemedicine and the availability of in-person appointments. I also discussed with the patient that there may be a patient responsible charge related to this service. The patient expressed understanding and agreed to proceed.    Other persons participating in the visit and their role in the encounter: RN/medical reconciliation Patient's location: office Provider's location: home  Oncology History   No history exists.    Chief Complaint: melanoma  History of present illness:Erica Barton 72 y.o.  female with history of undifferentiated B-cell low-grade lymphoma is here for follow-up-after recent diagnosis of melanoma.  Patient had a screening skin check with her dermatology that showed melanoma of the left flank-s/p shave biopsy.  She is also status post wide excision.  Denies any unusual shortness of breath or cough.  Denies any nausea vomiting.  Denies sweats.  Complains of chronic diarrhea-and mild weight loss associated with it.  Otherwise no lumps or bumps.   Observation/objective: Alert & oriented x 3. In No acute distress.   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  B-cell low-grade lymphoproliferative disorder.   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  DEC 2022-  No evidence of lymphadenopathy in the chest, abdomen, or pelvis;  Normal spleen;  There is a 3.2 x 1.8 cm soft tissue attenuation nodule of the left adrenal gland, most likely a benign, incidental adrenal adenoma. [2019-[facility]- stable]. AUG 2023- CT A [PCP]- no LN; see below.  No bone  marrow biopsy.  # However today- Hb 25 ; ALC- 19; Hb- 15.9 platelets- N; LDH- WNL; given absence of any ongoing obvious symptoms related to lymphoproliferative disorder I think is reasonable to continue his surveillance.  # Left flank: PT1a superficial spreading melanoma-April 18th, 2024 [Dr.Dasher] clark levels III- Breslow-0.4 mm; no ulceration/perineural invasion/lymphovascular invasion/0 mitoses; on April 23rd, 2024 [Dr.Dasher]- wide excision margin clear-as per patient.  Requested records from Dr. Durene Cal office.  Recommend continued surveillance with dermatology.  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 monitor for now.  Low clinical concerns for thromboembolic events at this time.  Monitor for now.  # AUG 2023- [PCP; Dr.Lambert]-  3.1 cm left adrenal nodule is stable since prior study with attenuation too high to allow classification as an adenoma. Interval stability is reassuring for benign etiology. Abdominal MRI recommended to further evaluate and potentially characterized as lipid poor adenoma. Will order MRI today.   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   #  Vaccination: s/p Flu shot/covid/ RSV-s/p pneumonia vaccination.  Again discussed to avoid any live vaccines.  Discussed and counseled regarding infectious precautions in general.  # DISPOSITION: # MRI abdomen # cancel the appt in June-  # follow up in 6 months- MD; labs- cbc/cmp;LDH-  Dr.B  # I reviewed the blood work- with the patient in detail; also reviewed the imaging independently [as summarized above]; and with the patient in detail.    Dr.Lambert; Dr.Dasher-   Follow-up instructions:  I discussed the assessment and treatment plan with the patient.  The patient was provided  an opportunity to ask questions and all were answered.  The patient agreed with the plan and  demonstrated understanding of instructions.  The patient was advised to call back or seek an in person evaluation if the symptoms worsen or if the condition fails to improve as anticipated.  Dr. Louretta Shorten [facility] 10/19/2022 11:19 AM

## 2022-10-22 ENCOUNTER — Encounter: Payer: Self-pay | Admitting: Dermatology

## 2022-10-24 ENCOUNTER — Ambulatory Visit
Admission: RE | Admit: 2022-10-24 | Discharge: 2022-10-24 | Disposition: A | Discharge status: Home / Self Care | Payer: Medicare Other | Source: Ambulatory Visit | Attending: Internal Medicine | Admitting: Internal Medicine

## 2022-10-24 DIAGNOSIS — E278 Other specified disorders of adrenal gland: Secondary | ICD-10-CM | POA: Insufficient documentation

## 2022-10-24 DIAGNOSIS — D47Z9 Other specified neoplasms of uncertain behavior of lymphoid, hematopoietic and related tissue: Secondary | ICD-10-CM | POA: Diagnosis present

## 2022-10-24 MED ORDER — GADOBUTROL 1 MMOL/ML IV SOLN
6.0000 mL | Freq: Once | INTRAVENOUS | Status: AC | PRN
  Administered 2022-10-24: 6 mL via INTRAVENOUS

## 2022-10-26 ENCOUNTER — Telehealth: Payer: Self-pay | Admitting: Internal Medicine

## 2022-10-26 NOTE — Telephone Encounter (Signed)
Spoke to patient regarding results of the MRI-left adrenal nodule stable/no concern for malignancy.  Right kidney angiomyolipoma-no further follow-up is recommended as per radiology.   Patient will follow-up with me as planned- in 6 months. GB

## 2022-11-14 ENCOUNTER — Ambulatory Visit: Payer: Medicare Other | Admitting: Internal Medicine

## 2022-11-14 ENCOUNTER — Other Ambulatory Visit: Payer: Medicare Other

## 2023-04-10 ENCOUNTER — Encounter: Payer: Self-pay | Admitting: Family Medicine

## 2023-04-22 ENCOUNTER — Other Ambulatory Visit: Payer: Medicare Other

## 2023-04-22 ENCOUNTER — Ambulatory Visit: Payer: Medicare Other | Admitting: Internal Medicine

## 2023-04-29 ENCOUNTER — Ambulatory Visit (HOSPITAL_BASED_OUTPATIENT_CLINIC_OR_DEPARTMENT_OTHER): Payer: Medicare Other | Admitting: Cardiology

## 2023-05-31 ENCOUNTER — Ambulatory Visit (INDEPENDENT_AMBULATORY_CARE_PROVIDER_SITE_OTHER): Payer: Medicare Other | Admitting: Cardiology

## 2023-05-31 ENCOUNTER — Encounter (HOSPITAL_BASED_OUTPATIENT_CLINIC_OR_DEPARTMENT_OTHER): Payer: Self-pay | Admitting: Cardiology

## 2023-05-31 VITALS — BP 110/64 | HR 69 | Ht 66.0 in | Wt 139.9 lb

## 2023-05-31 DIAGNOSIS — R0602 Shortness of breath: Secondary | ICD-10-CM

## 2023-05-31 DIAGNOSIS — R002 Palpitations: Secondary | ICD-10-CM

## 2023-05-31 DIAGNOSIS — I493 Ventricular premature depolarization: Secondary | ICD-10-CM

## 2023-05-31 DIAGNOSIS — I10 Essential (primary) hypertension: Secondary | ICD-10-CM

## 2023-05-31 NOTE — Progress Notes (Unsigned)
Cardiology Office Note:  .   Date:  05/31/2023  ID:  Shimere Levere, DOB 1950-07-01, MRN [id number] PCP: Orpha Bur, MD  [facility] Providers Cardiologist:  Jodelle Red, MD {  History of Present Illness: .   Peris Shafiq is a 72 y.o. female with a hx of nephrolithiasis, hyperuricosuria, and hypercalciuria, who is seen in follow-up today. She was initially seen 02/15/2022 as a new consult at the request of Orpha Bur, MD for the evaluation and management of palpitations.   Family history: Her father had "terrible" heart disease, cardiomyopathy, known heavy smoker. Her identical twin does not have heart issues, she notes they usually parallel together. Her older brother had an early heart attack, and is hypertensive.  CV history: Calcium score 2022 8.8. Echo 2023 >55%, no significant valve disease, normal RV.  Monitor 2023 with rare brief pSVT, 3.8% PVC burden.  Today: Doing very well. Can do 6-8 flights of stairs, brisk walking without symptoms. Yesterday made a total of 16 flights of stairs with heavy bags because the elevators were not working.  Notes palpitations daily but they do not have any associated symptoms. Can trigger these by lying on a certain side.  Had melanoma, margins were all clear.  ROS: Denies chest pain, shortness of breath at rest or with normal exertion. No PND, orthopnea, LE edema or unexpected weight gain. No syncope. ROS otherwise negative except as noted.   Studies Reviewed: Marland Kitchen    EKG:  EKG Interpretation Date/Time:  Friday May 31 2023 13:19:46 EST Ventricular Rate:  69 PR Interval:  152 QRS Duration:  78 QT Interval:  414 QTC Calculation: 443 R Axis:   -49  Text Interpretation: Normal sinus rhythm Possible Left atrial enlargement Left axis deviation Confirmed by Jodelle Red [phone]) on 05/31/2023 1:49:52 PM    Physical Exam:   VS:  BP 110/64   Pulse 69   Ht 5\' 6"  (1.676 m)   Wt 139 lb 14.4 oz (63.5 kg)   SpO2 97%    BMI 22.58 kg/m    Wt Readings from Last 3 Encounters:  05/31/23 139 lb 14.4 oz (63.5 kg)  10/19/22 130 lb (59 kg)  05/15/22 138 lb 12.8 oz (63 kg)    GEN: Well nourished, well developed in no acute distress HEENT: Normal, moist mucous membranes NECK: No JVD CARDIAC: regular rhythm, normal S1 and S2, no rubs or gallops. No murmur. VASCULAR: Radial and DP pulses 2+ bilaterally. No carotid bruits RESPIRATORY:  Clear to auscultation without rales, wheezing or rhonchi  ABDOMEN: Soft, non-tender, non-distended MUSCULOSKELETAL:  Ambulates independently SKIN: Warm and dry, no edema NEUROLOGIC:  Alert and oriented x 3. No focal neuro deficits noted. PSYCHIATRIC:  Normal affect    ASSESSMENT AND PLAN: .    Palpitations PVCs -symptoms stable. PVC burden ~3.8% on monitor, rare/brief pSVT -reviewed red flag signs that need immediate medical attention   Shortness of breath -CTPE without PE. Not gated to evaluate coronaries -echo unremarkable -symptoms improved. If worsen again, consider ETT   Hypertension -at goal on amiloride-HCTZ   CV risk counseling and prevention -recommend heart healthy/Mediterranean diet, with whole grains, fruits, vegetable, fish, lean meats, nuts, and olive oil. Limit salt. -recommend moderate walking, 3-5 times/week for 30-50 minutes each session. Aim for at least 150 minutes.week. Goal should be pace of 3 miles/hours, or walking 1.5 miles in 30 minutes -recommend avoidance of tobacco products. Avoid excess alcohol.  Dispo: 12 mos or sooner as needed  Signed, Jodelle Red, MD  Jodelle Red, MD, PhD, [facility]  [facility]  [place]  [facility] at [facility] at [facility] [address], [address] [place], [place] [address] [phone]

## 2023-05-31 NOTE — Patient Instructions (Signed)

## 2023-06-12 IMAGING — CT CT CHEST-ABD-PELV W/ CM
3 of 5 series · 14 of 36 positions shown, 16 images · IV contrast (omnipaque)
Comparison: None.

CLINICAL DATA: Lymphoma staging

EXAM:
CT CHEST, ABDOMEN, AND PELVIS WITH CONTRAST
TECHNIQUE: Multidetector CT imaging of the chest, abdomen and pelvis was
performed following the standard protocol during bolus
administration of intravenous contrast.
CONTRAST:  100mL OMNIPAQUE IOHEXOL 300 MG/ML SOLN, additional oral
enteric contrast

[Series 3: cap with · axial · 0.63mm/px · z∈[-932,-412]mm · 9 of 132 slices shown, 11 images]
[im 14/132  mediastinal]
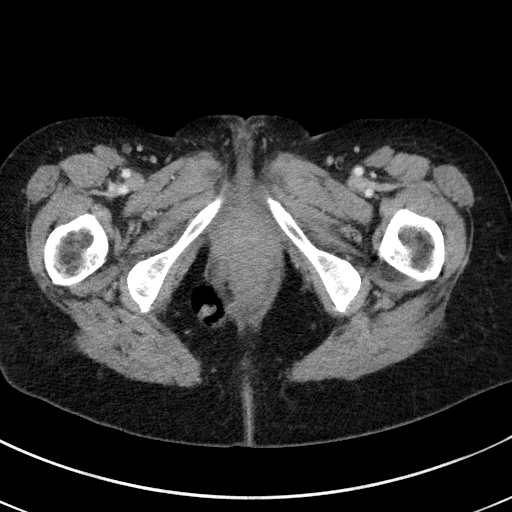
[im 14/132  bone]
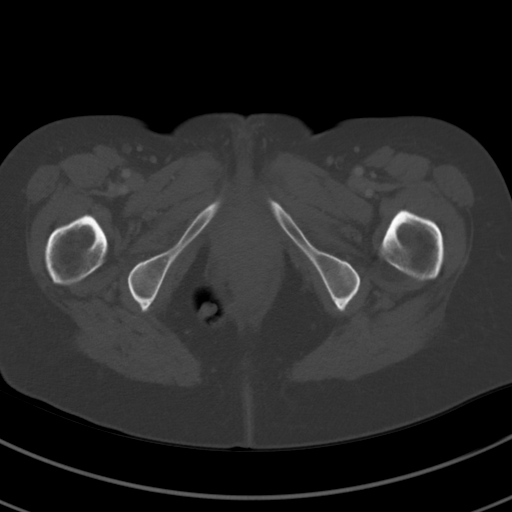
[im 27/132  mediastinal]
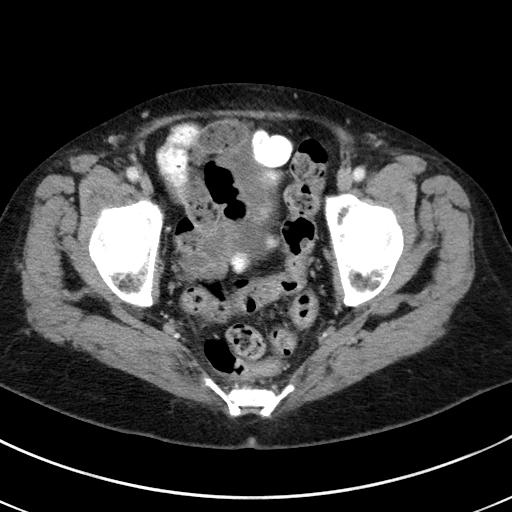
[im 40/132  mediastinal]
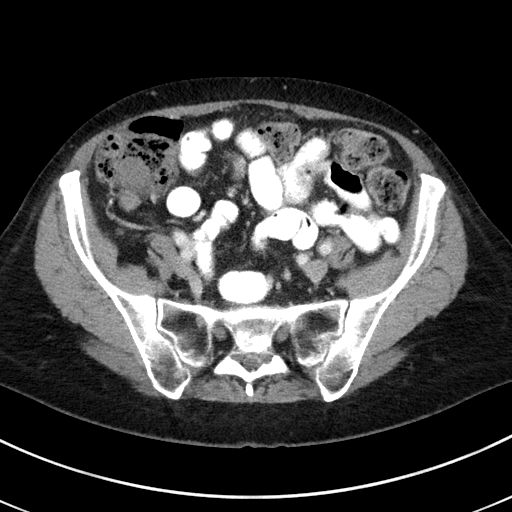
[im 53/132  mediastinal]
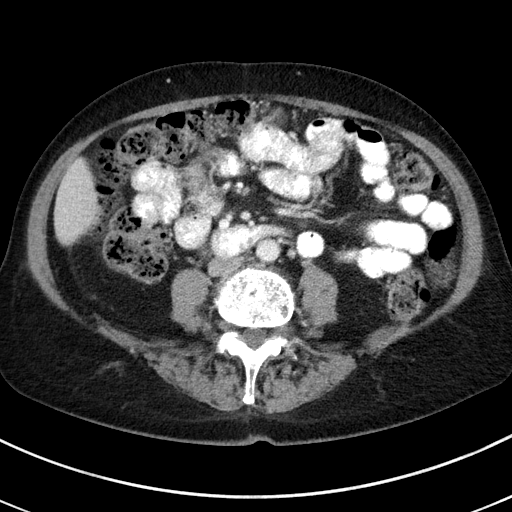
[im 66/132  mediastinal]
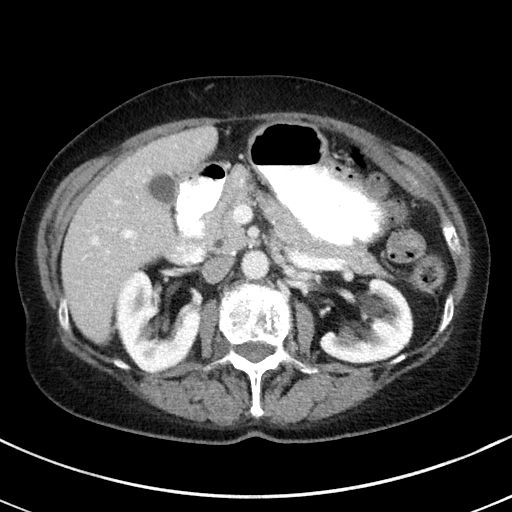
[im 79/132  mediastinal]
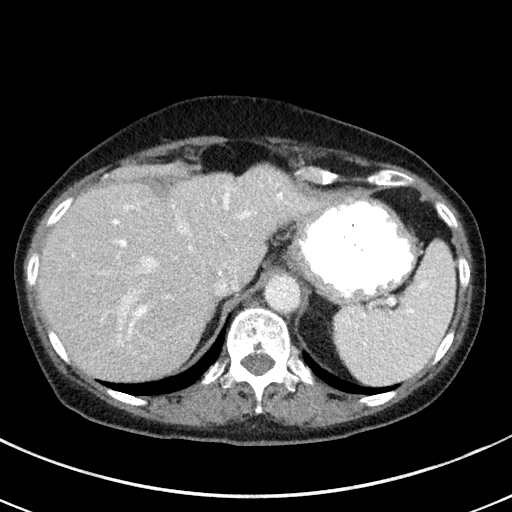
[im 92/132  mediastinal]
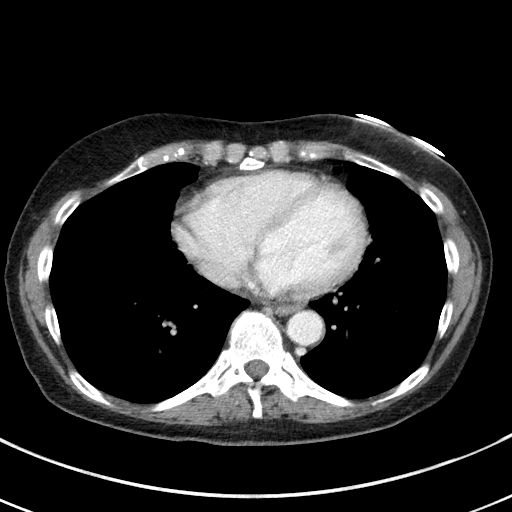
[im 105/132  mediastinal]
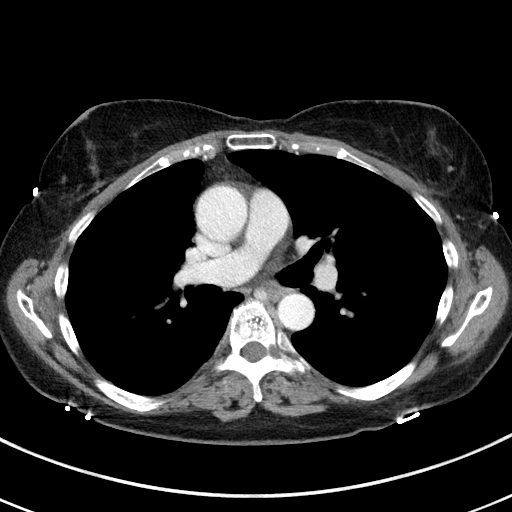
[im 118/132  mediastinal]
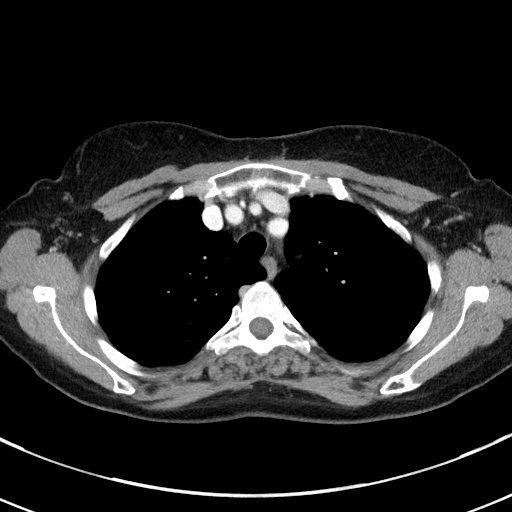
[im 118/132  bone]
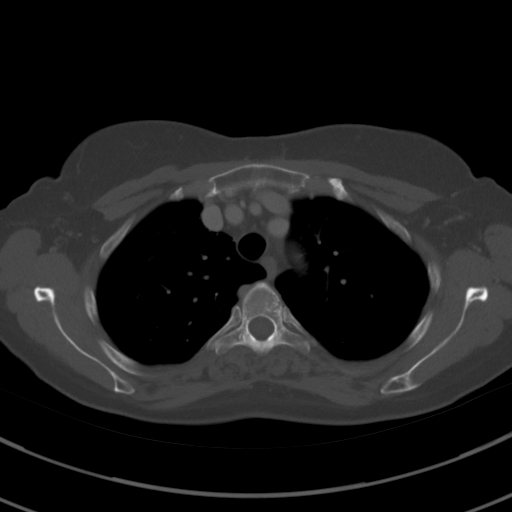

[Series 5: coronals · coronal · 0.67mm/px · 3 of 114 slices shown]
[im 23/114  mediastinal]
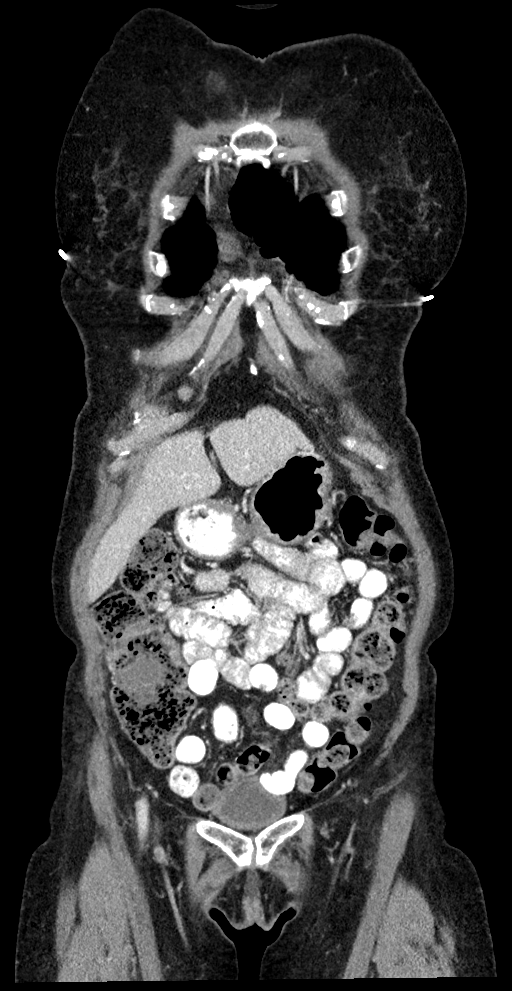
[im 46/114  mediastinal]
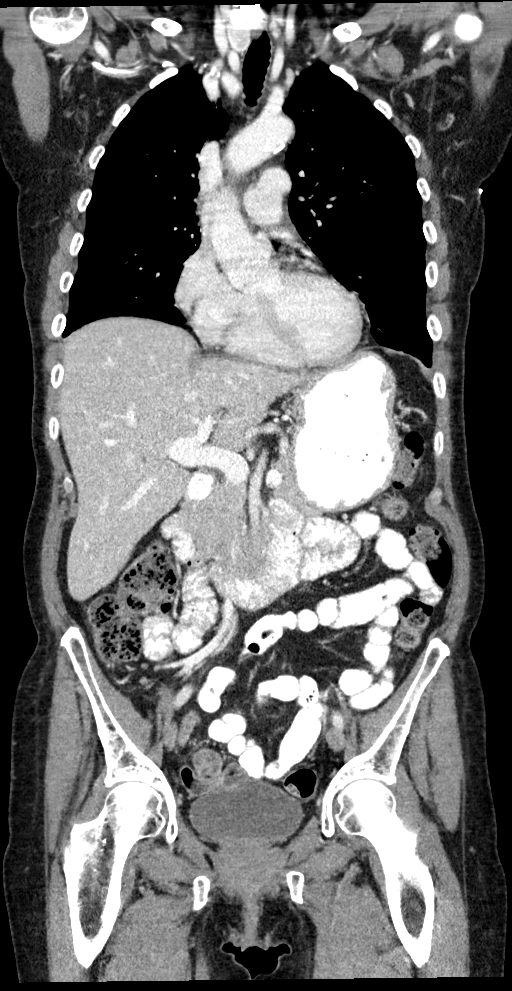
[im 68/114  mediastinal]
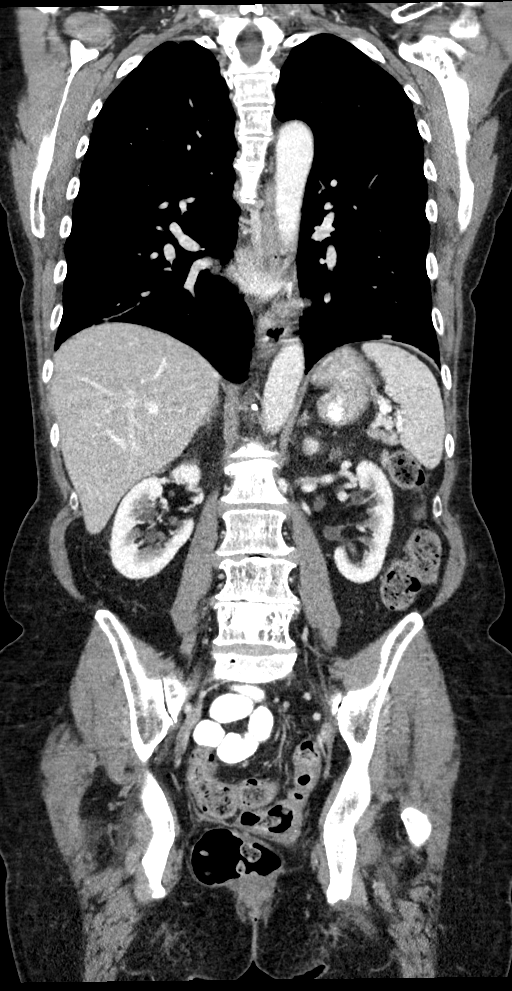

[Series 7: lung · axial · 0.60mm/px · z∈[-610,-562]mm · 2 of 147 slices shown]
[im 13/147  bone]
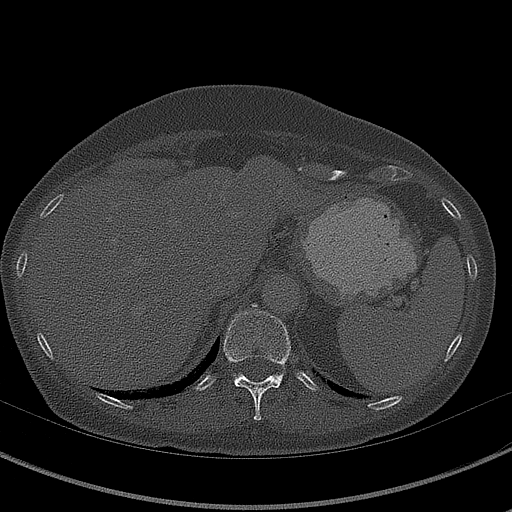
[im 37/147  bone]
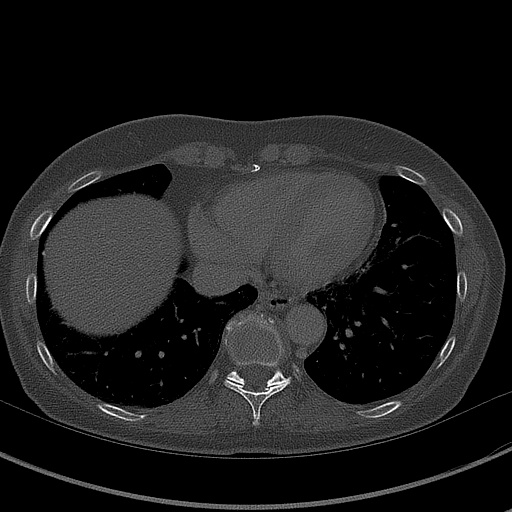

[14 of 36 positions shown; findings below may reference images not displayed]

FINDINGS: CT CHEST FINDINGS

Cardiovascular: Aortic atherosclerosis. Normal heart size. No
pericardial effusion.

Mediastinum/Nodes: No enlarged mediastinal, hilar, or axillary lymph
nodes. Thyroid gland, trachea, and esophagus demonstrate no
significant findings.

Lungs/Pleura: Lungs are clear. No pleural effusion or pneumothorax.

Musculoskeletal: No chest wall mass or suspicious bone lesions
identified.

CT ABDOMEN PELVIS FINDINGS

Hepatobiliary: No solid liver abnormality is seen. Scattered small
liver cysts. No gallstones, gallbladder wall thickening, or biliary
dilatation.

Pancreas: Unremarkable. No pancreatic ductal dilatation or
surrounding inflammatory changes.

Spleen: Normal in size without significant abnormality.

Adrenals/Urinary Tract: There is a 3.2 x 1.8 cm soft tissue
attenuation nodule of the left adrenal gland (series 3, image 60)
small nonobstructive calculus of the superior pole of left kidney.
Small, benign angiomyolipoma of the superior pole of the right
kidney (series 5, image 77). No hydronephrosis. Bladder is
unremarkable.

Stomach/Bowel: Stomach is within normal limits. Status post
appendectomy. No evidence of bowel wall thickening, distention, or
inflammatory changes.

Vascular/Lymphatic: Aortic atherosclerosis. No enlarged abdominal or
pelvic lymph nodes.

Reproductive: Status post hysterectomy.

Other: No abdominal wall hernia or abnormality. No abdominopelvic
ascites.

Musculoskeletal: No acute or significant osseous findings. Multiple
benign, trabeculated vertebral body hemangiomata.
IMPRESSION: 1. No evidence of lymphadenopathy in the chest, abdomen, or pelvis.
2. Normal spleen.
3. There is a 3.2 x 1.8 cm soft tissue attenuation nodule of the
left adrenal gland, most likely a benign, incidental adrenal
adenoma. Recommend adrenal protocol CT or MRI to further evaluate
given size. Comparison to prior imaging, if available, could also be
made to establish stability and benign nature.
4. Nonobstructive left nephrolithiasis.

Aortic Atherosclerosis ([id number]-72J.J).

## 2023-07-23 ENCOUNTER — Other Ambulatory Visit: Payer: Self-pay | Admitting: *Deleted

## 2023-07-23 DIAGNOSIS — D47Z9 Other specified neoplasms of uncertain behavior of lymphoid, hematopoietic and related tissue: Secondary | ICD-10-CM

## 2023-07-24 ENCOUNTER — Encounter: Payer: Self-pay | Admitting: Internal Medicine

## 2023-07-24 ENCOUNTER — Inpatient Hospital Stay (HOSPITAL_BASED_OUTPATIENT_CLINIC_OR_DEPARTMENT_OTHER): Payer: Medicare Other | Admitting: Internal Medicine

## 2023-07-24 ENCOUNTER — Inpatient Hospital Stay: Payer: Medicare Other | Attending: Internal Medicine

## 2023-07-24 VITALS — BP 109/75 | HR 87 | Temp 98.7°F | Resp 16 | Wt 135.0 lb

## 2023-07-24 DIAGNOSIS — Z807 Family history of other malignant neoplasms of lymphoid, hematopoietic and related tissues: Secondary | ICD-10-CM | POA: Insufficient documentation

## 2023-07-24 DIAGNOSIS — D47Z9 Other specified neoplasms of uncertain behavior of lymphoid, hematopoietic and related tissue: Secondary | ICD-10-CM | POA: Diagnosis not present

## 2023-07-24 DIAGNOSIS — Z8049 Family history of malignant neoplasm of other genital organs: Secondary | ICD-10-CM | POA: Diagnosis not present

## 2023-07-24 DIAGNOSIS — Z809 Family history of malignant neoplasm, unspecified: Secondary | ICD-10-CM | POA: Insufficient documentation

## 2023-07-24 DIAGNOSIS — Z90710 Acquired absence of both cervix and uterus: Secondary | ICD-10-CM | POA: Diagnosis not present

## 2023-07-24 DIAGNOSIS — R002 Palpitations: Secondary | ICD-10-CM | POA: Diagnosis not present

## 2023-07-24 DIAGNOSIS — Z91041 Radiographic dye allergy status: Secondary | ICD-10-CM | POA: Insufficient documentation

## 2023-07-24 DIAGNOSIS — Z79899 Other long term (current) drug therapy: Secondary | ICD-10-CM | POA: Insufficient documentation

## 2023-07-24 DIAGNOSIS — R0602 Shortness of breath: Secondary | ICD-10-CM | POA: Diagnosis not present

## 2023-07-24 LAB — CMP (CANCER CENTER ONLY)
ALT: 25 U/L (ref 0–44)
AST: 25 U/L (ref 15–41)
Albumin: 4 g/dL (ref 3.5–5.0)
Alkaline Phosphatase: 60 U/L (ref 38–126)
Anion gap: 10 (ref 5–15)
BUN: 18 mg/dL (ref 8–23)
CO2: 27 mmol/L (ref 22–32)
Calcium: 9.9 mg/dL (ref 8.9–10.3)
Chloride: 101 mmol/L (ref 98–111)
Creatinine: 0.79 mg/dL (ref 0.44–1.00)
GFR, Estimated: >60 mL/min (ref >60)
Glucose, Bld: 100 mg/dL — ABNORMAL HIGH (ref 70–99)
Potassium: 4.2 mmol/L (ref 3.5–5.1)
Sodium: 138 mmol/L (ref 135–145)
Total Bilirubin: 0.9 mg/dL (ref 0.0–1.2)
Total Protein: 7.2 g/dL (ref 6.5–8.1)

## 2023-07-24 LAB — CBC WITH DIFFERENTIAL/PLATELET
Abs Immature Granulocytes: 0.08 10*3/uL — ABNORMAL HIGH (ref 0.00–0.07)
Basophils Absolute: 0.1 10*3/uL (ref 0.0–0.1)
Basophils Relative: 0 %
Eosinophils Absolute: 0.1 10*3/uL (ref 0.0–0.5)
Eosinophils Relative: 1 %
HCT: 52.1 % — ABNORMAL HIGH (ref 36.0–46.0)
Hemoglobin: 17.4 g/dL — ABNORMAL HIGH (ref 12.0–15.0)
Immature Granulocytes: 0 %
Lymphocytes Relative: 83 %
Lymphs Abs: 23.8 10*3/uL — ABNORMAL HIGH (ref 0.7–4.0)
MCH: 31.5 pg (ref 26.0–34.0)
MCHC: 33.4 g/dL (ref 30.0–36.0)
MCV: 94.2 fL (ref 80.0–100.0)
Monocytes Absolute: 0.6 10*3/uL (ref 0.1–1.0)
Monocytes Relative: 2 %
Neutro Abs: 4 10*3/uL (ref 1.7–7.7)
Neutrophils Relative %: 14 %
Platelets: 280 10*3/uL (ref 150–400)
RBC: 5.53 MIL/uL — ABNORMAL HIGH (ref 3.87–5.11)
RDW: 13.2 % (ref 11.5–15.5)
Smear Review: NORMAL
WBC: 28.6 10*3/uL — ABNORMAL HIGH (ref 4.0–10.5)
nRBC: 0.1 % (ref 0.0–0.2)

## 2023-07-24 LAB — LACTATE DEHYDROGENASE: LDH: 137 U/L (ref 98–192)

## 2023-07-24 NOTE — Assessment & Plan Note (Addendum)
#   SEP 2022- WBC 15; ALC 12; Hb-16; platlets normal.  B-cell low-grade lymphoproliferative disorder.   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  DEC 2022-  No evidence of lymphadenopathy in the chest, abdomen, or pelvis;  Normal spleen;  There is a 3.2 x 1.8 cm soft tissue attenuation nodule of the left adrenal gland, most likely a benign, incidental adrenal adenoma. [2019-[facility] imaging- stable]. AUG 2023- CT A [PCP]- no LN; see below.  No bone marrow biopsy.  # However today- Hb 28  ; ALC- 23  Hb- 17.  platelets- N; LDH- WNL discussed the slow progressive nature of the underlying lymphoma/leukemia.  However, given absence of any ongoing obvious symptoms related to lymphoproliferative disorder I think is reasonable to continue his surveillance.  Discussed that if interested would recommend further evaluation with bone marrow biopsy.  However patient declined-which is very reasonable at this time.  However see discussion below.  # Left flank: PT1a superficial spreading melanoma-April 18th, 2024 [Dr.Dasher] clark levels III- Breslow-0.4 mm; no ulceration/perineural invasion/lymphovascular invasion/0 mitoses; on April 23rd, 2024 [Dr.Dasher]- wide excision margin clear-as per patient. Stable.   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 monitor for now.  Low clinical concerns for thromboembolic events at this time.  Stable.   # AUG 2023- [PCP; Dr.Lambert]-  3.1 cm left adrenal nodule - MRI MAY 2024- benign-no further follow-up surveillance is recommended.  # Depression versus prolonged grief-patient states that she is not depressed.  However concerned about her loss of joy in life, and to keep going especially after the loss of her husband.  Discussed regarding evaluation with psychiatry/counseling.  Patient reluctant.  I offered to speak to her PCP, which she is agreement with.  I have sent a message to speak with  PCP.  # DISPOSITION: # follow up in 6 months- MD; labs- cbc/cmp;LDH-  Dr.B  # I reviewed the blood work- with the patient in detail; also reviewed the imaging independently [as summarized above]; and with the patient in detail.    Dr.Lambert; Dr.Dasher-

## 2023-07-24 NOTE — Progress Notes (Signed)
[facility] OFFICE PROGRESS NOTE  Patient Care Team: Orpha Bur, MD as PCP - General (Family Medicine) Jodelle Red, MD as PCP - Cardiology (Cardiology) [name], MD as Consulting Physician (Oncology)   # HEMATOLOGY HISTORY:  # LEUCOCYTOSIS- WBC-; N; L; Hb- platelets   Oncology History   No history exists.    INTERVAL HISTORY: Ambulating independently.  Alone.  Erica Barton 73 y.o.  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  Patient is doing ok, she is following up with her cardiologist for her shortness of breath and her heart palpitations. Her chronic diarrhea has improved with using the lomotil.   Patient denies any significant weight loss.  Denies any night sweats.  Denies any nausea vomiting.  No new lumps or bumps.   However patient states that she does not feel motivated enough to move with her life after the passing of her husband.  She has recently moved to in [place].  Review of Systems  Constitutional:  Negative for chills, diaphoresis, fever, malaise/fatigue and weight loss.  HENT:  Negative for nosebleeds and sore throat.   Eyes:  Negative for double vision.  Respiratory:  Negative for cough, hemoptysis, sputum production, shortness of breath and wheezing.   Cardiovascular:  Negative for chest pain, palpitations, orthopnea and leg swelling.  Gastrointestinal:  Negative for abdominal pain, blood in stool, constipation, diarrhea, heartburn, melena, nausea and vomiting.  Genitourinary:  Negative for dysuria, frequency and urgency.  Musculoskeletal:  Negative for back pain and joint pain.  Skin: Negative.  Negative for itching and rash.  Neurological:  Negative for dizziness, tingling, focal weakness, weakness and headaches.  Endo/Heme/Allergies:  Does not bruise/bleed easily.  Psychiatric/Behavioral:  Negative for depression. The  patient is not nervous/anxious and does not have insomnia.       PAST MEDICAL HISTORY :  Past Medical History:  Diagnosis Date   Allergy    Hypercalciuria    Hyperuricosuria    Nephrolithiasis     PAST SURGICAL HISTORY :   Past Surgical History:  Procedure Laterality Date   ABDOMINAL HYSTERECTOMY     ADENOIDECTOMY     anterior cervicl cal fusion     TONSILLECTOMY     triger finger release Left     FAMILY HISTORY :   Family History  Problem Relation Age of Onset   Uterine cancer Mother    Non-Hodgkin's lymphoma Brother    Cancer Maternal Grandmother        unknown   Cancer Maternal Grandfather        unknown   Cancer Paternal Grandmother        unknown   Cancer Paternal Grandfather        unknown    SOCIAL HISTORY:   Social History   Tobacco Use   Smoking status: Never   Smokeless tobacco: Never  Substance Use Topics   Alcohol use: Never   Drug use: Never    ALLERGIES:  is allergic to beef (bovine) protein and contrast media [iodinated contrast media].  MEDICATIONS:  Current Outpatient Medications  Medication Sig Dispense Refill   diphenoxylate-atropine (LOMOTIL) 2.5-0.025 MG tablet Take by mouth.     amiloride-hydrochlorothiazide (MODURETIC) 5-50 MG tablet Take 1 tablet by mouth daily.     Cholecalciferol (VITAMIN D) 50 MCG (2000 UT) tablet Take 2,000 Units by mouth daily.     diphenhydrAMINE (BENADRYL) 50 MG capsule Take by mouth.  EPINEPHrine 0.3 mg/0.3 mL IJ SOAJ injection      estradiol (ESTRACE) 0.1 MG/GM vaginal cream Place 1 g vaginally once a week.     hydrOXYzine (ATARAX/VISTARIL) 25 MG tablet 1-2 every 6-8 hours for itching     Multiple Vitamins-Minerals (CENTRUM SILVER) tablet Centrum Silver  1 tablet by mouth once daily     Omega-3 Fatty Acids (OMEGA-3 2100) 1050 MG CAPS Omega 3  1280 mg - 3 capsules once daily     predniSONE (DELTASONE) 50 MG tablet Take by mouth as needed.     No current facility-administered medications for this  visit.    PHYSICAL EXAMINATION:  BP 109/75 (BP Location: Left Arm, Patient Position: Sitting, Cuff Size: Normal)   Pulse 87   Temp 98.7 F (37.1 C) (Tympanic)   Resp 16   Wt 135 lb (61.2 kg)   SpO2 99%   BMI 21.79 kg/m   Filed Weights   07/24/23 1037  Weight: 135 lb (61.2 kg)     Physical Exam Vitals and nursing note reviewed.  HENT:     Head: Normocephalic and atraumatic.     Mouth/Throat:     Pharynx: Oropharynx is clear.  Eyes:     Extraocular Movements: Extraocular movements intact.     Pupils: Pupils are equal, round, and reactive to light.  Cardiovascular:     Rate and Rhythm: Normal rate and regular rhythm.  Pulmonary:     Comments: Decreased breath sounds bilaterally.  Abdominal:     Palpations: Abdomen is soft.  Musculoskeletal:        General: Normal range of motion.     Cervical back: Normal range of motion.  Skin:    General: Skin is warm.  Neurological:     General: No focal deficit present.     Mental Status: She is alert and oriented to person, place, and time.  Psychiatric:        Behavior: Behavior normal.        Judgment: Judgment normal.        LABORATORY DATA:  I have reviewed the data as listed    Component Value Date/Time   NA 138 07/24/2023 1021   K 4.2 07/24/2023 1021   CL 101 07/24/2023 1021   CO2 27 07/24/2023 1021   GLUCOSE 100 (H) 07/24/2023 1021   BUN 18 07/24/2023 1021   CREATININE 0.79 07/24/2023 1021   CALCIUM 9.9 07/24/2023 1021   PROT 7.2 07/24/2023 1021   ALBUMIN 4.0 07/24/2023 1021   AST 25 07/24/2023 1021   ALT 25 07/24/2023 1021   ALKPHOS 60 07/24/2023 1021   BILITOT 0.9 07/24/2023 1021   GFRNONAA >60 07/24/2023 1021    No results found for: "SPEP", "UPEP"  Lab Results  Component Value Date   WBC 28.6 (H) 07/24/2023   NEUTROABS 4.0 07/24/2023   HGB 17.4 (H) 07/24/2023   HCT 52.1 (H) 07/24/2023   MCV 94.2 07/24/2023   PLT 280 07/24/2023      Chemistry      Component Value Date/Time   NA 138  07/24/2023 1021   K 4.2 07/24/2023 1021   CL 101 07/24/2023 1021   CO2 27 07/24/2023 1021   BUN 18 07/24/2023 1021   CREATININE 0.79 07/24/2023 1021      Component Value Date/Time   CALCIUM 9.9 07/24/2023 1021   ALKPHOS 60 07/24/2023 1021   AST 25 07/24/2023 1021   ALT 25 07/24/2023 1021   BILITOT 0.9 07/24/2023 1021  RADIOGRAPHIC STUDIES: I have personally reviewed the radiological images as listed and agreed with the findings in the report. No results found.   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  B-cell low-grade lymphoproliferative disorder.   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  DEC 2022-  No evidence of lymphadenopathy in the chest, abdomen, or pelvis;  Normal spleen;  There is a 3.2 x 1.8 cm soft tissue attenuation nodule of the left adrenal gland, most likely a benign, incidental adrenal adenoma. [2019-[facility] imaging- stable]. AUG 2023- CT A [PCP]- no LN; see below.  No bone marrow biopsy.  # However today- Hb 28  ; ALC- 23  Hb- 17.  platelets- N; LDH- WNL discussed the slow progressive nature of the underlying lymphoma/leukemia.  However, given absence of any ongoing obvious symptoms related to lymphoproliferative disorder I think is reasonable to continue his surveillance.  Discussed that if interested would recommend further evaluation with bone marrow biopsy.  However patient declined-which is very reasonable at this time.  However see discussion below.  # Left flank: PT1a superficial spreading melanoma-April 18th, 2024 [Dr.Dasher] clark levels III- Breslow-0.4 mm; no ulceration/perineural invasion/lymphovascular invasion/0 mitoses; on April 23rd, 2024 [Dr.Dasher]- wide excision margin clear-as per patient. Stable.   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 monitor for now.  Low  clinical concerns for thromboembolic events at this time.  Stable.   # AUG 2023- [PCP; Dr.Lambert]-  3.1 cm left adrenal nodule - MRI MAY 2024- benign-no further follow-up surveillance is recommended.  # Depression versus prolonged grief-patient states that she is not depressed.  However concerned about her loss of joy in life, and to keep going especially after the loss of her husband.  Discussed regarding evaluation with psychiatry/counseling.  Patient reluctant.  I offered to speak to her PCP, which she is agreement with.  I have sent a message to speak with PCP.  # DISPOSITION: # follow up in 6 months- MD; labs- cbc/cmp;LDH-  Dr.B  # I reviewed the blood work- with the patient in detail; also reviewed the imaging independently [as summarized above]; and with the patient in detail.    Dr.Lambert; Dr.Dasher-     Orders Placed This Encounter  Procedures   CBC with Differential (Cancer Center Only)    Standing Status:   Future    Expected Date:   01/21/2024    Expiration Date:   07/23/2024   CMP (Cancer Center only)    Standing Status:   Future    Expected Date:   01/21/2024    Expiration Date:   07/23/2024   Lactate dehydrogenase    Standing Status:   Future    Expected Date:   01/21/2024    Expiration Date:   07/23/2024    All questions were answered. The patient knows to call the clinic with any problems, questions or concerns.      [name], MD 07/24/2023 11:37 AM

## 2023-07-24 NOTE — Progress Notes (Signed)
Patient is doing ok, she is following up with her cardiologist for her shortness of breath and her heart palpitations. Her chronic diarrhea has improved with using the lomotil.

## 2023-08-06 ENCOUNTER — Other Ambulatory Visit: Payer: Self-pay | Admitting: Medical Genetics

## 2023-08-10 ENCOUNTER — Other Ambulatory Visit
Admission: RE | Admit: 2023-08-10 | Discharge: 2023-08-10 | Disposition: A | Discharge status: Home / Self Care | Payer: Self-pay | Source: Ambulatory Visit | Attending: Medical Genetics | Admitting: Medical Genetics

## 2023-08-23 LAB — GENECONNECT MOLECULAR SCREEN: Genetic Analysis Overall Interpretation: NEGATIVE

## 2024-01-21 ENCOUNTER — Inpatient Hospital Stay: Payer: Medicare Other

## 2024-01-21 ENCOUNTER — Inpatient Hospital Stay: Payer: Medicare Other | Attending: Internal Medicine | Admitting: Internal Medicine

## 2024-01-21 ENCOUNTER — Encounter: Payer: Self-pay | Admitting: Internal Medicine

## 2024-01-21 VITALS — BP 120/87 | HR 72 | Temp 95.8°F | Resp 16 | Ht 66.0 in | Wt 141.4 lb

## 2024-01-21 DIAGNOSIS — C8310 Mantle cell lymphoma, unspecified site: Secondary | ICD-10-CM | POA: Insufficient documentation

## 2024-01-21 DIAGNOSIS — C9110 Chronic lymphocytic leukemia of B-cell type not having achieved remission: Secondary | ICD-10-CM | POA: Insufficient documentation

## 2024-01-21 DIAGNOSIS — Z90710 Acquired absence of both cervix and uterus: Secondary | ICD-10-CM | POA: Diagnosis not present

## 2024-01-21 DIAGNOSIS — Z809 Family history of malignant neoplasm, unspecified: Secondary | ICD-10-CM | POA: Insufficient documentation

## 2024-01-21 DIAGNOSIS — R7989 Other specified abnormal findings of blood chemistry: Secondary | ICD-10-CM | POA: Insufficient documentation

## 2024-01-21 DIAGNOSIS — Z91041 Radiographic dye allergy status: Secondary | ICD-10-CM | POA: Diagnosis not present

## 2024-01-21 DIAGNOSIS — Z807 Family history of other malignant neoplasms of lymphoid, hematopoietic and related tissues: Secondary | ICD-10-CM | POA: Diagnosis not present

## 2024-01-21 DIAGNOSIS — Z8049 Family history of malignant neoplasm of other genital organs: Secondary | ICD-10-CM | POA: Diagnosis not present

## 2024-01-21 DIAGNOSIS — Z79899 Other long term (current) drug therapy: Secondary | ICD-10-CM | POA: Insufficient documentation

## 2024-01-21 DIAGNOSIS — D47Z9 Other specified neoplasms of uncertain behavior of lymphoid, hematopoietic and related tissue: Secondary | ICD-10-CM

## 2024-01-21 LAB — CMP (CANCER CENTER ONLY)
ALT: 27 U/L (ref 0–44)
AST: 26 U/L (ref 15–41)
Albumin: 3.5 g/dL (ref 3.5–5.0)
Alkaline Phosphatase: 61 U/L (ref 38–126)
Anion gap: 7 (ref 5–15)
BUN: 16 mg/dL (ref 8–23)
CO2: 26 mmol/L (ref 22–32)
Calcium: 9.8 mg/dL (ref 8.9–10.3)
Chloride: 104 mmol/L (ref 98–111)
Creatinine: 0.68 mg/dL (ref 0.44–1.00)
GFR, Estimated: >60 mL/min (ref >60)
Glucose, Bld: 101 mg/dL — ABNORMAL HIGH (ref 70–99)
Potassium: 4.2 mmol/L (ref 3.5–5.1)
Sodium: 137 mmol/L (ref 135–145)
Total Bilirubin: 0.8 mg/dL (ref 0.0–1.2)
Total Protein: 6.4 g/dL — ABNORMAL LOW (ref 6.5–8.1)

## 2024-01-21 LAB — CBC WITH DIFFERENTIAL (CANCER CENTER ONLY)
Abs Immature Granulocytes: 0.07 K/uL (ref 0.00–0.07)
Basophils Absolute: 0.1 K/uL (ref 0.0–0.1)
Basophils Relative: 0 %
Eosinophils Absolute: 0.2 K/uL (ref 0.0–0.5)
Eosinophils Relative: 1 %
HCT: 48.7 % — ABNORMAL HIGH (ref 36.0–46.0)
Hemoglobin: 16.2 g/dL — ABNORMAL HIGH (ref 12.0–15.0)
Immature Granulocytes: 0 %
Lymphocytes Relative: 84 %
Lymphs Abs: 25.3 K/uL — ABNORMAL HIGH (ref 0.7–4.0)
MCH: 31.6 pg (ref 26.0–34.0)
MCHC: 33.3 g/dL (ref 30.0–36.0)
MCV: 94.9 fL (ref 80.0–100.0)
Monocytes Absolute: 0.6 K/uL (ref 0.1–1.0)
Monocytes Relative: 2 %
Neutro Abs: 3.9 K/uL (ref 1.7–7.7)
Neutrophils Relative %: 13 %
Platelet Count: 266 K/uL (ref 150–400)
RBC: 5.13 MIL/uL — ABNORMAL HIGH (ref 3.87–5.11)
RDW: 13.4 % (ref 11.5–15.5)
Smear Review: NORMAL
WBC Count: 30.2 K/uL — ABNORMAL HIGH (ref 4.0–10.5)
nRBC: 0 % (ref 0.0–0.2)

## 2024-01-21 LAB — LACTATE DEHYDROGENASE: LDH: 142 U/L (ref 98–192)

## 2024-01-21 NOTE — Progress Notes (Signed)
 [facility] OFFICE PROGRESS NOTE  Patient Care Team: Cindie Poe, MD as PCP - General (Family Medicine) Lonni Slain, MD as PCP - Cardiology (Cardiology) Rennie Cindy SAUNDERS, MD as Consulting Physician (Oncology)   # HEMATOLOGY HISTORY:  # LEUCOCYTOSIS- WBC-; N; L; Hb- platelets   Oncology History   No history exists.    INTERVAL HISTORY: Ambulating independently.  Alone.  Erica Barton 73 y.o.  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  Patient currently getting treatment for her varicose veins.   Otherwise patient denies any significant weight loss.  Denies any night sweats.  Denies any nausea vomiting.  No new lumps or bumps.    Review of Systems  Constitutional:  Negative for chills, diaphoresis, fever, malaise/fatigue and weight loss.  HENT:  Negative for nosebleeds and sore throat.   Eyes:  Negative for double vision.  Respiratory:  Negative for cough, hemoptysis, sputum production, shortness of breath and wheezing.   Cardiovascular:  Negative for chest pain, palpitations, orthopnea and leg swelling.  Gastrointestinal:  Negative for abdominal pain, blood in stool, constipation, diarrhea, heartburn, melena, nausea and vomiting.  Genitourinary:  Negative for dysuria, frequency and urgency.  Musculoskeletal:  Negative for back pain and joint pain.  Skin: Negative.  Negative for itching and rash.  Neurological:  Negative for dizziness, tingling, focal weakness, weakness and headaches.  Endo/Heme/Allergies:  Does not bruise/bleed easily.  Psychiatric/Behavioral:  Negative for depression. The patient is not nervous/anxious and does not have insomnia.       PAST MEDICAL HISTORY :  Past Medical History:  Diagnosis Date   Allergy    Hypercalciuria    Hyperuricosuria    Nephrolithiasis     PAST SURGICAL HISTORY :   Past Surgical History:   Procedure Laterality Date   ABDOMINAL HYSTERECTOMY     ADENOIDECTOMY     anterior cervicl cal fusion     TONSILLECTOMY     triger finger release Left     FAMILY HISTORY :   Family History  Problem Relation Age of Onset   Uterine cancer Mother    Non-Hodgkin's lymphoma Brother    Cancer Maternal Grandmother        unknown   Cancer Maternal Grandfather        unknown   Cancer Paternal Grandmother        unknown   Cancer Paternal Grandfather        unknown    SOCIAL HISTORY:   Social History   Tobacco Use   Smoking status: Never   Smokeless tobacco: Never  Substance Use Topics   Alcohol use: Never   Drug use: Never    ALLERGIES:  is allergic to beef (bovine) protein and contrast media [iodinated contrast media].  MEDICATIONS:  Current Outpatient Medications  Medication Sig Dispense Refill   amiloride-hydrochlorothiazide (MODURETIC) 5-50 MG tablet Take 1 tablet by mouth daily.     Cholecalciferol (VITAMIN D) 50 MCG (2000 UT) tablet Take 2,000 Units by mouth daily.     diphenhydrAMINE (BENADRYL) 50 MG capsule Take by mouth.     diphenoxylate-atropine (LOMOTIL) 2.5-0.025 MG tablet Take by mouth.     EPINEPHrine 0.3 mg/0.3 mL IJ SOAJ injection      estradiol (ESTRACE) 0.1 MG/GM vaginal cream Place 1 g vaginally once a week.     hydrOXYzine (ATARAX/VISTARIL) 25 MG tablet 1-2 every 6-8 hours for itching     Multiple Vitamins-Minerals (  CENTRUM SILVER) tablet Centrum Silver  1 tablet by mouth once daily     Omega-3 Fatty Acids (OMEGA-3 2100) 1050 MG CAPS Omega 3  1280 mg - 3 capsules once daily     predniSONE (DELTASONE) 50 MG tablet Take by mouth as needed.     No current facility-administered medications for this visit.    PHYSICAL EXAMINATION:  BP 120/87 (BP Location: Left Arm, Patient Position: Sitting, Cuff Size: Normal)   Pulse 72   Temp (!) 95.8 F (35.4 C) (Tympanic)   Resp 16   Ht 5' 6 (1.676 m)   Wt 141 lb 6.4 oz (64.1 kg)   SpO2 98%   BMI 22.82  kg/m   Filed Weights   01/21/24 1004  Weight: 141 lb 6.4 oz (64.1 kg)     Physical Exam Vitals and nursing note reviewed.  HENT:     Head: Normocephalic and atraumatic.     Mouth/Throat:     Pharynx: Oropharynx is clear.  Eyes:     Extraocular Movements: Extraocular movements intact.     Pupils: Pupils are equal, round, and reactive to light.  Cardiovascular:     Rate and Rhythm: Normal rate and regular rhythm.  Pulmonary:     Comments: Decreased breath sounds bilaterally.  Abdominal:     Palpations: Abdomen is soft.  Musculoskeletal:        General: Normal range of motion.     Cervical back: Normal range of motion.  Skin:    General: Skin is warm.  Neurological:     General: No focal deficit present.     Mental Status: She is alert and oriented to person, place, and time.  Psychiatric:        Behavior: Behavior normal.        Judgment: Judgment normal.        LABORATORY DATA:  I have reviewed the data as listed    Component Value Date/Time   NA 137 01/21/2024 1006   K 4.2 01/21/2024 1006   CL 104 01/21/2024 1006   CO2 26 01/21/2024 1006   GLUCOSE 101 (H) 01/21/2024 1006   BUN 16 01/21/2024 1006   CREATININE 0.68 01/21/2024 1006   CALCIUM 9.8 01/21/2024 1006   PROT 6.4 (L) 01/21/2024 1006   ALBUMIN 3.5 01/21/2024 1006   AST 26 01/21/2024 1006   ALT 27 01/21/2024 1006   ALKPHOS 61 01/21/2024 1006   BILITOT 0.8 01/21/2024 1006   GFRNONAA >60 01/21/2024 1006    No results found for: SPEP, UPEP  Lab Results  Component Value Date   WBC 30.2 (H) 01/21/2024   NEUTROABS 3.9 01/21/2024   HGB 16.2 (H) 01/21/2024   HCT 48.7 (H) 01/21/2024   MCV 94.9 01/21/2024   PLT 266 01/21/2024      Chemistry      Component Value Date/Time   NA 137 01/21/2024 1006   K 4.2 01/21/2024 1006   CL 104 01/21/2024 1006   CO2 26 01/21/2024 1006   BUN 16 01/21/2024 1006   CREATININE 0.68 01/21/2024 1006      Component Value Date/Time   CALCIUM 9.8 01/21/2024  1006   ALKPHOS 61 01/21/2024 1006   AST 26 01/21/2024 1006   ALT 27 01/21/2024 1006   BILITOT 0.8 01/21/2024 1006       RADIOGRAPHIC STUDIES: I have personally reviewed the radiological images as listed and agreed with the findings in the report. No results found.   ASSESSMENT & PLAN:  Low grade  B cell lymphoproliferative disorder (HCC) # SEP 2022- WBC 15; ALC 12; Hb-16; platlets normal.  B-cell low-grade lymphoproliferative disorder.   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  DEC 2022-  No evidence of lymphadenopathy in the chest, abdomen, or pelvis;  Normal spleen;  There is a 3.2 x 1.8 cm soft tissue attenuation nodule of the left adrenal gland, most likely a benign, incidental adrenal adenoma. [2019-[facility] imaging- stable]. AUG 2023- CT A [PCP]- no LN; see below.  No bone marrow biopsy. Wil order CLL FISH PANEL at next visit.   # However today- WBC 30- Hb- 17.  platelets- N; LDH- WNL discussed the slow progressive nature of the underlying lymphoma/leukemia.  However, given absence of any ongoing obvious symptoms related to lymphoproliferative disorder I think is reasonable to continue his surveillance.  Discussed that if interested would recommend further evaluation with bone marrow biopsy.  However patient declined-which is very reasonable at this time.  However see discussion below. Patient prefers follow up 12 months- will call us  sooner if symptomatic.   # Left flank: PT1a superficial spreading melanoma-April 18th, 2024 [Dr.Dasher] clark levels III- Breslow-0.4 mm; no ulceration/perineural invasion/lymphovascular invasion/0 mitoses; on April 23rd, 2024 [Dr.Dasher]- wide excision margin clear-as per patient. Stable.   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 monitor for now.  Low clinical concerns for thromboembolic events at this time.  Stable.     Pt pref- 46m # DISPOSITION: # follow up  in 12  months- MD; labs- cbc/cmp;LDH-CLL FISH PANEL-  Dr.B   Dr.Lambert; Dr.Dasher-      Orders Placed This Encounter  Procedures   CBC with Differential (Cancer Center Only)    Standing Status:   Future    Expected Date:   01/20/2025    Expiration Date:   01/21/2025   CMP (Cancer Center only)    Standing Status:   Future    Expected Date:   01/20/2025    Expiration Date:   04/20/2025   Lactate dehydrogenase    Standing Status:   Future    Expected Date:   01/20/2025    Expiration Date:   01/21/2025   Miscellaneous [facility] test (send-out)    Standing Status:   Future    Expected Date:   01/20/2025    Expiration Date:   04/20/2025    Test name / description::   TEST: [id number]; CLL FISH PROGNOSTIC PANEL    All questions were answered. The patient knows to call the clinic with any problems, questions or concerns.      Cindy JONELLE Joe, MD 01/21/2024 11:19 AM

## 2024-01-21 NOTE — Assessment & Plan Note (Addendum)
#   SEP 2022- WBC 15; ALC 12; Hb-16; platlets normal.  B-cell low-grade lymphoproliferative disorder.   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  DEC 2022-  No evidence of lymphadenopathy in the chest, abdomen, or pelvis;  Normal spleen;  There is a 3.2 x 1.8 cm soft tissue attenuation nodule of the left adrenal gland, most likely a benign, incidental adrenal adenoma. [2019-[facility]- stable]. AUG 2023- CT A [PCP]- no LN; see below.  No bone marrow biopsy. Wil order CLL FISH PANEL at next visit.   # However today- WBC 30- Hb- 17.  platelets- N; LDH- WNL discussed the slow progressive nature of the underlying lymphoma/leukemia.  However, given absence of any ongoing obvious symptoms related to lymphoproliferative disorder I think is reasonable to continue his surveillance.  Discussed that if interested would recommend further evaluation with bone marrow biopsy.  However patient declined-which is very reasonable at this time.  However see discussion below. Patient prefers follow up 12 months- will call us  sooner if symptomatic.   # Left flank: PT1a superficial spreading melanoma-April 18th, 2024 [Dr.Dasher] clark levels III- Breslow-0.4 mm; no ulceration/perineural invasion/lymphovascular invasion/0 mitoses; on April 23rd, 2024 [Dr.Dasher]- wide excision margin clear-as per patient. Stable.   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 monitor for now.  Low clinical concerns for thromboembolic events at this time.  Stable.     Pt pref- 57m # DISPOSITION: # follow up in 12  months- MD; labs- cbc/cmp;LDH-CLL FISH PANEL-  Dr.B   Dr.Lambert; Dr.Dasher-

## 2024-01-21 NOTE — Progress Notes (Signed)
 She has had some radiofrequency to varicose veins rt leg, Dr. Cayetano Sax.  No concerns today.

## 2024-02-13 ENCOUNTER — Encounter: Payer: Self-pay | Admitting: Internal Medicine

## 2024-02-21 ENCOUNTER — Telehealth: Payer: Self-pay | Admitting: Cardiology

## 2024-02-21 NOTE — Telephone Encounter (Signed)
 STAT SOB call transferred to [facility] triage.   Patient of Dr. Lonni last seen 05/2023. Patient reports worsening SOB for 2 weeks. She reports she has limited her activity due to SOB and is also experiencing SOB at rest. She says SOB at rest seems to last for 60 seconds. She says during these episodes she manually palpates her pulse and notices her pulse is irregular even though her HR has not been higher than 102. She does not check her HR at home.   Offered appt with Dr. Lonni on 02/26/24, patient accepts. Reviewed ED precautions. Patient verbalizes understanding to go to the ED or call 911 if SOB worsens.

## 2024-02-21 NOTE — Telephone Encounter (Signed)
 Pt c/o Shortness Of Breath: STAT if SOB developed within the last 24 hours or pt is noticeably SOB on the phone  1. Are you currently SOB (can you hear that pt is SOB on the phone)? yes  2. How long have you been experiencing SOB? Last 2 weeks but has gotta worse   3. Are you SOB when sitting or when up moving around? both  4. Are you currently experiencing any other symptoms? no

## 2024-02-26 ENCOUNTER — Encounter (HOSPITAL_BASED_OUTPATIENT_CLINIC_OR_DEPARTMENT_OTHER): Payer: Self-pay | Admitting: Cardiology

## 2024-02-26 ENCOUNTER — Ambulatory Visit (INDEPENDENT_AMBULATORY_CARE_PROVIDER_SITE_OTHER): Admitting: Cardiology

## 2024-02-26 ENCOUNTER — Ambulatory Visit: Attending: Cardiology

## 2024-02-26 VITALS — BP 102/60 | HR 78 | Resp 17 | Ht 66.0 in | Wt 147.0 lb

## 2024-02-26 DIAGNOSIS — I493 Ventricular premature depolarization: Secondary | ICD-10-CM | POA: Diagnosis not present

## 2024-02-26 DIAGNOSIS — R6889 Other general symptoms and signs: Secondary | ICD-10-CM | POA: Diagnosis not present

## 2024-02-26 DIAGNOSIS — R0602 Shortness of breath: Secondary | ICD-10-CM | POA: Diagnosis not present

## 2024-02-26 DIAGNOSIS — R002 Palpitations: Secondary | ICD-10-CM

## 2024-02-26 DIAGNOSIS — I10 Essential (primary) hypertension: Secondary | ICD-10-CM

## 2024-02-26 NOTE — Progress Notes (Unsigned)
 Enrolled for [employer] to mail a ZIO XT long term holter monitor to the patients address on file.

## 2024-02-26 NOTE — Patient Instructions (Signed)
 Medication Instructions:   Your physician recommends that you continue on your current medications as directed. Please refer to the Current Medication list given to you today.  *If you need a refill on your cardiac medications before your next appointment, please call your pharmacy*   Testing/Procedures:  ZIO XT- Long Term Monitor Instructions  Your physician has requested you wear a ZIO patch monitor for 14 days.  This is a single patch monitor. [employer] supplies one patch monitor per enrollment. Additional stickers are not available. Please do not apply patch if you will be having a Nuclear Stress Test,  Echocardiogram, Cardiac CT, MRI, or Chest Xray during the period you would be wearing the  monitor. The patch cannot be worn during these tests. You cannot remove and re-apply the  ZIO XT patch monitor.  Your ZIO patch monitor will be mailed 3 day USPS to your address on file. It may take 3-5 days  to receive your monitor after you have been enrolled.  Once you have received your monitor, please review the enclosed instructions. Your monitor  has already been registered assigning a specific monitor serial # to you.  Billing and Patient Assistance Program Information  We have supplied [employer] with any of your insurance information on file for billing purposes. [employer] offers a sliding scale Patient Assistance Program for patients that do not have  insurance, or whose insurance does not completely cover the cost of the ZIO monitor.  You must apply for the Patient Assistance Program to qualify for this discounted rate.  To apply, please call [employer] at [phone], select option 4, select option 2, ask to apply for  Patient Assistance Program. Meredeth will ask your household income, and how many people  are in your household. They will quote your out-of-pocket cost based on that information.  [employer] will also be able to set up a 62-month, interest-free payment plan if  needed.  Applying the monitor   Shave hair from upper left chest.  Hold abrader disc by orange tab. Rub abrader in 40 strokes over the upper left chest as  indicated in your monitor instructions.  Clean area with 4 enclosed alcohol pads. Let dry.  Apply patch as indicated in monitor instructions. Patch will be placed under collarbone on left  side of chest with arrow pointing upward.  Rub patch adhesive wings for 2 minutes. Remove white label marked 1. Remove the white  label marked 2. Rub patch adhesive wings for 2 additional minutes.  While looking in a mirror, press and release button in center of patch. A small green light will  flash 3-4 times. This will be your only indicator that the monitor has been turned on.  Do not shower for the first 24 hours. You may shower after the first 24 hours.  Press the button if you feel a symptom. You will hear a small click. Record Date, Time and  Symptom in the Patient Logbook.  When you are ready to remove the patch, follow instructions on the last 2 pages of Patient  Logbook. Stick patch monitor onto the last page of Patient Logbook.  Place Patient Logbook in the blue and white box. Use locking tab on box and tape box closed  securely. The blue and white box has prepaid postage on it. Please place it in the mailbox as  soon as possible. Your physician should have your test results approximately 7 days after the  monitor has been mailed back to [facility].  Call [employer]  Customer Care at [phone] if you have questions regarding  your ZIO XT patch monitor. Call them immediately if you see an orange light blinking on your  monitor.  If your monitor falls off in less than 4 days, contact our Monitor department at [phone].  If your monitor becomes loose or falls off after 4 days call [employer] at [phone] for  suggestions on securing your monitor   Follow-Up:  3 MONTHS WITH DR. LONNI

## 2024-02-26 NOTE — Progress Notes (Signed)
 Cardiology Office Note:  .   Date:  02/26/2024  ID:  Erica Barton, DOB [birth year], MRN [id number] PCP: Cindie Poe, MD  [facility] Providers Cardiologist:  Shelda Bruckner, MD {  History of Present Illness: .   Erica Barton is a 73 y.o. female with a hx of nephrolithiasis, hyperuricosuria, and hypercalciuria, who is seen in follow-up today. She was initially seen 02/15/2022 as a new consult at the request of Cindie Poe, MD for the evaluation and management of palpitations.   Family history: Her father had terrible heart disease, cardiomyopathy, known heavy smoker. Her identical twin does not have heart issues, she notes they usually parallel together. Her older brother had an early heart attack, and is hypertensive.  CV history: Calcium score 2022 8.8. Echo 2023 >55%, no significant valve disease, normal RV.  Monitor 2023 with rare brief pSVT, 3.8% PVC burden.  Today: Patient called in reporting two weeks of worsening shortness of breath. Note mentioned rapid heart rates but she denies this--has noticed more ectopy with shortness of breath. She notes that she was having more shortness of breath in July/August, attributed it to the heat. However, with weather improving, shortness of breath is getting worse. No fevers/chills. Has a cough, nonproductive. No clear relationship to time of day, position. Worse with activity but present even at rest. No PND or orthopnea. No edema. No chest pain. No syncope.  Shortness of breath comes in spells. If spell occurs with activity, and she rests, spell resolves within a minute. If it happens at rest, lasts 30-60 seconds. Happens multiple times a day.   Used to walk 2-3 miles, climb several flights of stairs, but now very limited.  No history of lung issues.   ROS: ROS otherwise negative except as noted.   Studies Reviewed: SABRA    EKG:  EKG Interpretation Date/Time:  Wednesday February 26 2024 14:44:22 EDT Ventricular Rate:  76 PR  Interval:  140 QRS Duration:  74 QT Interval:  388 QTC Calculation: 436 R Axis:   -48  Text Interpretation: Sinus rhythm with occasional Premature ventricular complexes Left axis deviation Minimal voltage criteria for LVH, may be normal variant Cannot rule out Anteroseptal infarct , age undetermined Confirmed by Bruckner Shelda [phone]) on 02/26/2024 5:49:19 PM    Physical Exam:   VS:  BP 102/60 (BP Location: Left Arm, Patient Position: Sitting, Cuff Size: Normal)   Pulse 78   Resp 17   Ht 5' 6 (1.676 m)   Wt 147 lb (66.7 kg)   SpO2 96%   BMI 23.73 kg/m    Wt Readings from Last 3 Encounters:  02/26/24 147 lb (66.7 kg)  01/21/24 141 lb 6.4 oz (64.1 kg)  07/24/23 135 lb (61.2 kg)    GEN: Well nourished, well developed in no acute distress HEENT: Normal, moist mucous membranes NECK: No JVD CARDIAC: regular rhythm, normal S1 and S2, no rubs or gallops. No murmur. VASCULAR: Radial and DP pulses 2+ bilaterally. No carotid bruits RESPIRATORY:  Clear to auscultation without rales, wheezing or rhonchi  ABDOMEN: Soft, non-tender, non-distended MUSCULOSKELETAL:  Ambulates independently SKIN: Warm and dry, no edema NEUROLOGIC:  Alert and oriented x 3. No focal neuro deficits noted. PSYCHIATRIC:  Normal affect    ASSESSMENT AND PLAN: .    Shortness breath Palpitations PVCs Exercise intolerance -progressive shortness of breath both with activity and at rest, brief but frequent, notices more ectopy during these episodes -has caused exercise intolerance -no HF symptoms otherwise. No prior history of  lung disease -will repeat monitor. Discussed repeating echo as well, though this was unremarkable in 2023.  -also discussed ETT, though symptoms are not solely exertional -if monitor shows high burden of PVCs, would do ischemic eval, either ETT or coronary CT -when she was having shortness of breath in the past, CTPE was unremarkable. Her SOB is not constant as would be expected if  this was PE -reviewed red flag signs that need immediate medical attention   Hypertension -at goal on amiloride-HCTZ   CV risk counseling and prevention -recommend heart healthy/Mediterranean diet, with whole grains, fruits, vegetable, fish, lean meats, nuts, and olive oil. Limit salt. -recommend moderate walking, 3-5 times/week for 30-50 minutes each session. Aim for at least 150 minutes.week. Goal should be pace of 3 miles/hours, or walking 1.5 miles in 30 minutes -recommend avoidance of tobacco products. Avoid excess alcohol.  Dispo: 3 mos or sooner as needed  Signed, Shelda Bruckner, MD   Shelda Bruckner, MD, PhD, [facility] [place]  [facility]  [place]  [facility] & V[facility] at [facility] at [facility] [address] [place], [place] [address] [phone]

## 2024-04-30 ENCOUNTER — Ambulatory Visit (HOSPITAL_BASED_OUTPATIENT_CLINIC_OR_DEPARTMENT_OTHER): Payer: Self-pay | Admitting: Cardiology

## 2024-04-30 DIAGNOSIS — R002 Palpitations: Secondary | ICD-10-CM

## 2024-05-29 ENCOUNTER — Encounter (HOSPITAL_BASED_OUTPATIENT_CLINIC_OR_DEPARTMENT_OTHER): Payer: Self-pay | Admitting: Cardiology

## 2024-05-29 ENCOUNTER — Ambulatory Visit (INDEPENDENT_AMBULATORY_CARE_PROVIDER_SITE_OTHER): Admitting: Cardiology

## 2024-05-29 VITALS — BP 108/56 | HR 63 | Ht 66.0 in | Wt 138.6 lb

## 2024-05-29 DIAGNOSIS — R6889 Other general symptoms and signs: Secondary | ICD-10-CM

## 2024-05-29 DIAGNOSIS — R002 Palpitations: Secondary | ICD-10-CM

## 2024-05-29 DIAGNOSIS — I10 Essential (primary) hypertension: Secondary | ICD-10-CM | POA: Diagnosis not present

## 2024-05-29 DIAGNOSIS — I493 Ventricular premature depolarization: Secondary | ICD-10-CM

## 2024-05-29 DIAGNOSIS — I491 Atrial premature depolarization: Secondary | ICD-10-CM

## 2024-05-29 NOTE — Progress Notes (Signed)
 " Cardiology Office Note:  .   Date:  05/29/2024  ID:  Erica Barton, DOB [birth year], MRN [id number] PCP: Cindie Poe, MD  [facility] Providers Cardiologist:  Shelda Bruckner, MD {  History of Present Illness: .   Erica Barton is a 73 y.o. female with a hx of nephrolithiasis, hyperuricosuria, and hypercalciuria, who is seen in follow-up today. She was initially seen 02/15/2022 as a new consult at the request of Cindie Poe, MD for the evaluation and management of palpitations.   Family history: Her father had terrible heart disease, cardiomyopathy, known heavy smoker. Her identical twin does not have heart issues, she notes they usually parallel together. Her older brother had an early heart attack, and is hypertensive.  CV history: Calcium score 2022 8.8. Echo 2023 >55%, no significant valve disease, normal RV.  Monitor 2023 with rare brief pSVT, 3.8% PVC burden.  Today: Shortness of breath is better. Wonders if this was related to stressful event she had. Still feels ectopic beats, reviewed her monitor, reassuring. Has PACs and PVCs, <5% for each, but do correlate with symptoms. Hasn't been as physically active, notes this is more because the less she does, the less she wants to do. She can climb stairs but can't do multiple flights all at one time like she used to, shortness of breath is what stops her.  ROS: Denies chest pain, shortness of breath at rest. No PND, orthopnea, LE edema or unexpected weight gain. No syncope. ROS otherwise negative except as noted.   Studies Reviewed: SABRA    EKG:       Physical Exam:   VS:  BP (!) 108/56 (BP Location: Right Arm, Patient Position: Sitting, Cuff Size: Normal)   Pulse 63   Ht 5' 6 (1.676 m)   Wt 138 lb 9.6 oz (62.9 kg)   SpO2 97%   BMI 22.37 kg/m    Wt Readings from Last 3 Encounters:  05/29/24 138 lb 9.6 oz (62.9 kg)  02/26/24 147 lb (66.7 kg)  01/21/24 141 lb 6.4 oz (64.1 kg)    GEN: Well nourished, well developed in  no acute distress HEENT: Normal, moist mucous membranes NECK: No JVD CARDIAC: regular rhythm, normal S1 and S2, no rubs or gallops. No murmur. VASCULAR: Radial and DP pulses 2+ bilaterally. No carotid bruits RESPIRATORY:  Clear to auscultation without rales, wheezing or rhonchi  ABDOMEN: Soft, non-tender, non-distended MUSCULOSKELETAL:  Ambulates independently SKIN: Warm and dry, no edema NEUROLOGIC:  Alert and oriented x 3. No focal neuro deficits noted. PSYCHIATRIC:  Normal affect    ASSESSMENT AND PLAN: .    Palpitations PVCs, PACs Exercise intolerance -shortness of breath better -reviewed her monitor today -echo was unremarkable in 2023.  -we discussed options for further evaluation. For now, we will work to gradually increase activity and monitor her exercise ability. If she cannot gradually increase activity, or her symptoms worsen, then she will contact me sooner and we will discuss additional testing -reviewed red flag signs that need immediate medical attention   Hypertension -at goal on amiloride-HCTZ   CV risk counseling and prevention -recommend heart healthy/Mediterranean diet, with whole grains, fruits, vegetable, fish, lean meats, nuts, and olive oil. Limit salt. -recommend moderate walking, 3-5 times/week for 30-50 minutes each session. Aim for at least 150 minutes.week. Goal should be pace of 3 miles/hours, or walking 1.5 miles in 30 minutes -recommend avoidance of tobacco products. Avoid excess alcohol.  Dispo: 3 mos or sooner as needed  Signed, Mahlia Fernando  Lonni, MD   Shelda Lonni, MD, PhD, [facility] [place]  [facility]  [place]  [facility] V[facility] at [facility] at [facility] [address] [place], [place] [address] [phone]   "

## 2024-05-29 NOTE — Patient Instructions (Signed)
 Medication Instructions:  No changes *If you need a refill on your cardiac medications before your next appointment, please call your pharmacy*  Lab Work: none   Testing/Procedures: none  Follow-Up: At [facility], you and your health needs are our priority.  As part of our continuing mission to provide you with exceptional heart care, our providers are all part of one team.  This team includes your primary Cardiologist (physician) and Advanced Practice Providers or APPs (Physician Assistants and Nurse Practitioners) who all work together to provide you with the care you need, when you need it.  Your next appointment:   3 month(s)  Provider:   Shelda Bruckner, MD, Rosaline Bane, NP, or Reche Finder, NP

## 2024-08-28 ENCOUNTER — Ambulatory Visit (HOSPITAL_BASED_OUTPATIENT_CLINIC_OR_DEPARTMENT_OTHER): Admitting: Cardiology

## 2025-01-20 ENCOUNTER — Ambulatory Visit: Admitting: Internal Medicine

## 2025-01-20 ENCOUNTER — Other Ambulatory Visit
